# Patient Record
Sex: Female | Born: 1950 | Race: White | Hispanic: No | Smoking: Never smoker
Health system: Southern US, Community
[De-identification: ages and names within clinical notes are randomized; demographics above are authoritative.]

## PROBLEM LIST (undated history)

## (undated) DIAGNOSIS — R519 Headache, unspecified: Secondary | ICD-10-CM

## (undated) DIAGNOSIS — F419 Anxiety disorder, unspecified: Secondary | ICD-10-CM

## (undated) DIAGNOSIS — K219 Gastro-esophageal reflux disease without esophagitis: Secondary | ICD-10-CM

## (undated) DIAGNOSIS — E119 Type 2 diabetes mellitus without complications: Secondary | ICD-10-CM

## (undated) DIAGNOSIS — R7303 Prediabetes: Secondary | ICD-10-CM

## (undated) DIAGNOSIS — J449 Chronic obstructive pulmonary disease, unspecified: Secondary | ICD-10-CM

## (undated) DIAGNOSIS — D649 Anemia, unspecified: Secondary | ICD-10-CM

## (undated) DIAGNOSIS — D869 Sarcoidosis, unspecified: Secondary | ICD-10-CM

## (undated) DIAGNOSIS — R32 Unspecified urinary incontinence: Secondary | ICD-10-CM

## (undated) DIAGNOSIS — R51 Headache: Secondary | ICD-10-CM

## (undated) DIAGNOSIS — I10 Essential (primary) hypertension: Secondary | ICD-10-CM

## (undated) DIAGNOSIS — H269 Unspecified cataract: Secondary | ICD-10-CM

## (undated) DIAGNOSIS — E785 Hyperlipidemia, unspecified: Secondary | ICD-10-CM

## (undated) HISTORY — DX: Anxiety disorder, unspecified: F41.9

## (undated) HISTORY — PX: TUBAL LIGATION: SHX77

## (undated) HISTORY — DX: Type 2 diabetes mellitus without complications: E11.9

## (undated) HISTORY — DX: Hyperlipidemia, unspecified: E78.5

## (undated) HISTORY — DX: Unspecified cataract: H26.9

---

## 1999-01-27 ENCOUNTER — Ambulatory Visit: Admission: RE | Admit: 1999-01-27 | Discharge: 1999-01-27 | Payer: Self-pay | Admitting: Critical Care Medicine

## 2000-01-27 ENCOUNTER — Ambulatory Visit: Admission: RE | Admit: 2000-01-27 | Discharge: 2000-01-27 | Payer: Self-pay | Admitting: Critical Care Medicine

## 2001-03-11 ENCOUNTER — Encounter: Admission: RE | Admit: 2001-03-11 | Discharge: 2001-03-11 | Payer: Self-pay | Admitting: Occupational Medicine

## 2001-03-11 ENCOUNTER — Encounter: Payer: Self-pay | Admitting: Occupational Medicine

## 2007-11-23 ENCOUNTER — Encounter: Admission: RE | Admit: 2007-11-23 | Discharge: 2007-11-23 | Payer: Self-pay | Admitting: Family Medicine

## 2016-05-03 ENCOUNTER — Observation Stay (HOSPITAL_BASED_OUTPATIENT_CLINIC_OR_DEPARTMENT_OTHER): Payer: Self-pay

## 2016-05-03 ENCOUNTER — Encounter (HOSPITAL_COMMUNITY): Payer: Self-pay | Admitting: Emergency Medicine

## 2016-05-03 ENCOUNTER — Observation Stay (HOSPITAL_COMMUNITY)
Admission: EM | Admit: 2016-05-03 | Discharge: 2016-05-03 | Disposition: A | Discharge status: Home / Self Care | Payer: Self-pay | Attending: Internal Medicine | Admitting: Internal Medicine

## 2016-05-03 ENCOUNTER — Emergency Department (HOSPITAL_COMMUNITY): Payer: Self-pay

## 2016-05-03 DIAGNOSIS — R079 Chest pain, unspecified: Secondary | ICD-10-CM

## 2016-05-03 DIAGNOSIS — I160 Hypertensive urgency: Principal | ICD-10-CM | POA: Insufficient documentation

## 2016-05-03 DIAGNOSIS — I10 Essential (primary) hypertension: Secondary | ICD-10-CM | POA: Diagnosis present

## 2016-05-03 DIAGNOSIS — J449 Chronic obstructive pulmonary disease, unspecified: Secondary | ICD-10-CM | POA: Diagnosis present

## 2016-05-03 DIAGNOSIS — D869 Sarcoidosis, unspecified: Secondary | ICD-10-CM | POA: Diagnosis present

## 2016-05-03 DIAGNOSIS — R9431 Abnormal electrocardiogram [ECG] [EKG]: Secondary | ICD-10-CM

## 2016-05-03 DIAGNOSIS — J42 Unspecified chronic bronchitis: Secondary | ICD-10-CM

## 2016-05-03 DIAGNOSIS — Z79899 Other long term (current) drug therapy: Secondary | ICD-10-CM | POA: Insufficient documentation

## 2016-05-03 DIAGNOSIS — M2550 Pain in unspecified joint: Secondary | ICD-10-CM | POA: Insufficient documentation

## 2016-05-03 HISTORY — DX: Chronic obstructive pulmonary disease, unspecified: J44.9

## 2016-05-03 HISTORY — DX: Sarcoidosis, unspecified: D86.9

## 2016-05-03 HISTORY — DX: Essential (primary) hypertension: I10

## 2016-05-03 LAB — BASIC METABOLIC PANEL
ANION GAP: 7 (ref 5–15)
BUN: 19 mg/dL (ref 6–20)
CO2: 26 mmol/L (ref 22–32)
Calcium: 9.6 mg/dL (ref 8.9–10.3)
Chloride: 103 mmol/L (ref 101–111)
Creatinine, Ser: 0.86 mg/dL (ref 0.44–1.00)
GFR calc Af Amer: >60 mL/min (ref >60)
GFR calc non Af Amer: >60 mL/min (ref >60)
GLUCOSE: 156 mg/dL — AB (ref 65–99)
POTASSIUM: 3.6 mmol/L (ref 3.5–5.1)
Sodium: 136 mmol/L (ref 135–145)

## 2016-05-03 LAB — CBC WITH DIFFERENTIAL/PLATELET
BASOS ABS: 0 10*3/uL (ref 0.0–0.1)
Basophils Relative: 0 %
Eosinophils Absolute: 0.1 10*3/uL (ref 0.0–0.7)
Eosinophils Relative: 1 %
HEMATOCRIT: 45.2 % (ref 36.0–46.0)
HEMOGLOBIN: 15.6 g/dL — AB (ref 12.0–15.0)
Lymphocytes Relative: 21 %
Lymphs Abs: 2.2 10*3/uL (ref 0.7–4.0)
MCH: 29.4 pg (ref 26.0–34.0)
MCHC: 34.5 g/dL (ref 30.0–36.0)
MCV: 85.1 fL (ref 78.0–100.0)
Monocytes Absolute: 0.6 10*3/uL (ref 0.1–1.0)
Monocytes Relative: 6 %
NEUTROS ABS: 7.2 10*3/uL (ref 1.7–7.7)
NEUTROS PCT: 72 %
Platelets: 230 10*3/uL (ref 150–400)
RBC: 5.31 MIL/uL — AB (ref 3.87–5.11)
RDW: 12.7 % (ref 11.5–15.5)
WBC: 10.2 10*3/uL (ref 4.0–10.5)

## 2016-05-03 LAB — TROPONIN I
Troponin I: <0.03 ng/mL (ref <0.03)
Troponin I: <0.03 ng/mL (ref <0.03)

## 2016-05-03 LAB — D-DIMER, QUANTITATIVE: D-Dimer, Quant: 0.36 ug/mL-FEU (ref 0.00–0.50)

## 2016-05-03 LAB — ECHOCARDIOGRAM COMPLETE
Height: 63 in
Weight: 3160 oz

## 2016-05-03 MED ORDER — GI COCKTAIL ~~LOC~~
30.0000 mL | Freq: Four times a day (QID) | ORAL | Status: DC | PRN
Start: 2016-05-03 — End: 2016-05-03

## 2016-05-03 MED ORDER — ASPIRIN EC 325 MG PO TBEC
325.0000 mg | DELAYED_RELEASE_TABLET | Freq: Every day | ORAL | Status: DC
  Administered 2016-05-03: 325 mg via ORAL
  Filled 2016-05-03: qty 1

## 2016-05-03 MED ORDER — ACETAMINOPHEN 325 MG PO TABS: 650.0000 mg | ORAL_TABLET | ORAL | Status: DC | PRN

## 2016-05-03 MED ORDER — ONDANSETRON HCL 4 MG/2ML IJ SOLN
4.0000 mg | Freq: Four times a day (QID) | INTRAMUSCULAR | Status: DC | PRN
Start: 2016-05-03 — End: 2016-05-03

## 2016-05-03 MED ORDER — MORPHINE SULFATE (PF) 4 MG/ML IV SOLN
4.0000 mg | Freq: Once | INTRAVENOUS | Status: DC
  Filled 2016-05-03: qty 1

## 2016-05-03 MED ORDER — METOPROLOL TARTRATE 25 MG PO TABS
25.0000 mg | ORAL_TABLET | Freq: Two times a day (BID) | ORAL | Status: DC
  Administered 2016-05-03: 25 mg via ORAL
  Filled 2016-05-03: qty 1

## 2016-05-03 MED ORDER — MORPHINE SULFATE (PF) 2 MG/ML IV SOLN: 2.0000 mg | INTRAVENOUS | Status: DC | PRN

## 2016-05-03 MED ORDER — NITROGLYCERIN 0.4 MG SL SUBL
0.4000 mg | SUBLINGUAL_TABLET | SUBLINGUAL | Status: DC | PRN
  Administered 2016-05-03: 0.4 mg via SUBLINGUAL
  Filled 2016-05-03: qty 1

## 2016-05-03 MED ORDER — ASPIRIN EC 81 MG PO TBEC: 81.0000 mg | DELAYED_RELEASE_TABLET | Freq: Every day | ORAL | Status: DC

## 2016-05-03 NOTE — Progress Notes (Signed)
Pt discharged in stable condition into the care of her daughter via private vehicle.  PIV removed intact and w/o S&S of complications.  Discharge instructions reviewed with pt.  Pt verbalized understanding.  Prescription given to pt.

## 2016-05-03 NOTE — Progress Notes (Signed)
  Echocardiogram 2D Echocardiogram has been performed.  Diamond Nickel 05/03/2016, 11:48 AM

## 2016-05-03 NOTE — ED Provider Notes (Signed)
[facility] DEPT Provider Note   CSN: [id number] Arrival date & time: 05/03/16  0115     History   Chief Complaint Chief Complaint  Patient presents with  . Left arm pain  . Dizziness    HPI Emma Russell is a 66 y.o. female.  Patient reports history of hypertension, sarcoidosis and COPD. She has not had any chronic medications and does not see a doctor. States she has not seen a doctor in 10-15 years. She presents to the ED tonight with left arm pain and achiness that has been hurting her constantly since about 4 PM. The pain is from her shoulder all the way down her arm and feels more like a "ache". It waxes and wanes in severity but never goes away completely. Nothing seen to make it better or worse. She did take aspirin at home as well as some magnesium. He denies any weakness or numbness in the arm. She denies any shortness of breath worse than baseline. She endorses some nausea and dizziness when she stands up. Denies any headache or vision change. Denies any history of heart problems and has never had a stress test. She does not smoke. She was told she was borderline diabetic. Denies any trauma to her arm. It started while she was out shopping. Denies chest pain, back pain, abdominal pain, neck pain.   The history is provided by the patient.  Dizziness  Associated symptoms: no chest pain, no headaches, no nausea, no vomiting and no weakness     Past Medical History:  Diagnosis Date  . COPD (chronic obstructive pulmonary disease) ([place])   . Hypertension   . Sarcoidosis ([place])     There are no active problems to display for this patient.   Past Surgical History:  Procedure Laterality Date  . TUBAL LIGATION      OB History    No data available       Home Medications    Prior to Admission medications   Not on File    Family History History reviewed. No pertinent family history.  Social History Social History  Substance Use Topics  . Smoking status:  Never Smoker  . Smokeless tobacco: Never Used  . Alcohol use Yes     Comment: occ     Allergies   Patient has no known allergies.   Review of Systems Review of Systems  Constitutional: Negative for activity change, appetite change, fatigue and fever.  HENT: Negative for congestion and rhinorrhea.   Respiratory: Negative for chest tightness.   Cardiovascular: Negative for chest pain.  Gastrointestinal: Negative for abdominal pain, nausea and vomiting.  Genitourinary: Negative for dysuria, hematuria, vaginal bleeding and vaginal discharge.  Musculoskeletal: Positive for arthralgias and myalgias.  Skin: Negative for rash.  Neurological: Positive for dizziness and light-headedness. Negative for weakness and headaches.  A complete 10 system review of systems was obtained and all systems are negative except as noted in the HPI and PMH.     Physical Exam Updated Vital Signs BP (!) 209/111 (BP Location: Left Arm)   Pulse 101   Temp 98.5 F (36.9 C) (Oral)   Resp 14   Ht 5\' 3"  (1.6 m)   Wt 194 lb (88 kg)   SpO2 98%   BMI 34.37 kg/m   Physical Exam  Constitutional: She is oriented to person, place, and time. She appears well-developed and well-nourished. No distress.  HENT:  Head: Normocephalic and atraumatic.  Mouth/Throat: Oropharynx is clear and moist. No oropharyngeal  exudate.  Eyes: Conjunctivae and EOM are normal. Pupils are equal, round, and reactive to light.  Neck: Normal range of motion. Neck supple.  No meningismus.  Cardiovascular: Normal rate, regular rhythm, normal heart sounds and intact distal pulses.   No murmur heard. Pulmonary/Chest: Effort normal and breath sounds normal. No respiratory distress. She exhibits no tenderness.  Abdominal: Soft. There is no tenderness. There is no rebound and no guarding.  Musculoskeletal: Normal range of motion. She exhibits no edema or tenderness.  Intact radial pulses bilaterally. Equal grip strengths.  FROM L shoulder,  elbow, hand, wrist  Neurological: She is alert and oriented to person, place, and time. No cranial nerve deficit. She exhibits normal muscle tone. Coordination normal.  No ataxia on finger to nose bilaterally. No pronator drift. 5/5 strength throughout. CN 2-12 intact.Equal grip strength. Sensation intact.   Skin: Skin is warm. Capillary refill takes less than 2 seconds.  Psychiatric: She has a normal mood and affect. Her behavior is normal.  Nursing note and vitals reviewed.    ED Treatments / Results  Labs (all labs ordered are listed, but only abnormal results are displayed) Labs Reviewed  CBC WITH DIFFERENTIAL/PLATELET - Abnormal; Notable for the following:       Result Value   RBC 5.31 (*)    Hemoglobin 15.6 (*)    All other components within normal limits  BASIC METABOLIC PANEL - Abnormal; Notable for the following:    Glucose, Bld 156 (*)    All other components within normal limits  TROPONIN I  D-DIMER, QUANTITATIVE (NOT AT [facility])    EKG  EKG Interpretation  Date/Time:  Sunday May 03 2016 01:32:28 EST Ventricular Rate:  108 PR Interval:    QRS Duration: 81 QT Interval:  327 QTC Calculation: 439 R Axis:   92 Text Interpretation:  Sinus tachycardia Right axis deviation Low voltage, precordial leads Probable anteroseptal infarct, old No previous ECGs available Confirmed by Wyvonnia Dusky  MD, Chasitie Passey [phone]) on 05/03/2016 1:49:26 AM       Radiology Dg Chest 2 View  Result Date: 05/03/2016 CLINICAL DATA:  Left arm pain with dizziness and nausea EXAM: CHEST  2 VIEW COMPARISON:  None. FINDINGS: Mild hyperinflation. Diffuse interstitial opacities possibly chronic but cannot exclude acute or superimposed interstitial process. No focal consolidation or effusion. Normal heart size. No pneumothorax. IMPRESSION: Diffuse interstitial opacities, possible chronic although in the absence of priors, difficult to exclude acute interstitial inflammation. No focal consolidation. Probable  areas of scarring in the left upper lobe and right mid lung. Electronically Signed   By: Donavan Foil M.D.   On: 05/03/2016 03:27    Procedures Procedures (including critical care time)  Medications Ordered in ED Medications - No data to display   Initial Impression / Assessment and Plan / ED Course  I have reviewed the triage vital signs and the nursing notes.  Pertinent labs & imaging results that were available during my care of the patient were reviewed by me and considered in my medical decision making (see chart for details).     Ongoing L arm pain for >8 hours.  Waxes and wanes in severity. No chest pain.  Associated with dizziness and nausea. EKG with sinus tachycardia. No ST changes.  BP 209 on L, 207 on R.  No chest or back pain. No neuro or pulse deficits.   Remains hypertensive but equal in each arm.  Troponin negative. D-dimer negative. Heart score 4.  Remains chest pain free in the  ED.  Declined nitroglycerin.  BP has improved to 185/101. Doubt pulmonary embolism, aortic dissection. Plan chest pain rule out. Observation admission d/w Dr. Shanon Brow.    Final Clinical Impressions(s) / ED Diagnoses   Final diagnoses:  Chest pain, unspecified type  Hypertensive urgency    New Prescriptions New Prescriptions   No medications on file     Ezequiel Essex, MD 05/03/16 [phone]

## 2016-05-03 NOTE — H&P (Signed)
History and Physical    LEVETA DEANGELO [id number] DOB: 12-07-1950 DOA: 05/03/2016  PCP: No PCP Per Patient  Patient coming from: home  Chief Complaint:   Arm pain  HPI: JERUSALEN LAMASTUS is a 66 y.o. female with medical history significant of sarcoidosis, HTN, comes in with left arm "achiness" that started around 2pm today and has progressively gotten worse.  The pain is in the left shoulder and arm, does not radiate anywhere or in the chest.  It is not worse with movement.  No recent injury.  She has not seen a doctor in years.  She has untreated hypertension.  Pt refused to take ntg or morphine in ED.  Her bp has come down alone and she says her "achiness" is now resolved.  She does not like to take medications.  She denies any fevers or cough.  Pt referred for admission for possible ACS.  Review of Systems: As per HPI otherwise 10 point review of systems negative.   Past Medical History:  Diagnosis Date  . COPD (chronic obstructive pulmonary disease) ([place])   . Hypertension   . Sarcoidosis [facility])     Past Surgical History:  Procedure Laterality Date  . TUBAL LIGATION       reports that she has never smoked. She has never used smokeless tobacco. She reports that she drinks alcohol. She reports that she does not use drugs.  No Known Allergies  History reviewed. No pertinent family history. no CAD  Prior to Admission medications   Not on File  none  Physical Exam: Vitals:   05/03/16 0230 05/03/16 0300 05/03/16 0330 05/03/16 0400  BP: 191/96 183/90 (!) 178/101 (!) 185/104  Pulse: 100 95 99 90  Resp: 20 12 15 11   Temp:      TempSrc:      SpO2: 93% 90% 95% 93%  Weight:      Height:        Constitutional: NAD, calm, comfortable Vitals:   05/03/16 0230 05/03/16 0300 05/03/16 0330 05/03/16 0400  BP: 191/96 183/90 (!) 178/101 (!) 185/104  Pulse: 100 95 99 90  Resp: 20 12 15 11   Temp:      TempSrc:      SpO2: 93% 90% 95% 93%  Weight:      Height:       Eyes: PERRL,  lids and conjunctivae normal ENMT: Mucous membranes are moist. Posterior pharynx clear of any exudate or lesions.Normal dentition.  Neck: normal, supple, no masses, no thyromegaly Respiratory: clear to auscultation bilaterally, no wheezing, no crackles. Normal respiratory effort. No accessory muscle use.  Cardiovascular: Regular rate and rhythm, no murmurs / rubs / gallops. No extremity edema. 2+ pedal pulses. No carotid bruits.  Abdomen: no tenderness, no masses palpated. No hepatosplenomegaly. Bowel sounds positive.  Musculoskeletal: no clubbing / cyanosis. No joint deformity upper and lower extremities. Good ROM, no contractures. Normal muscle tone.  Skin: no rashes, lesions, ulcers. No induration Neurologic: CN 2-12 grossly intact. Sensation intact, DTR normal. Strength 5/5 in all 4.  Psychiatric: Normal judgment and insight. Alert and oriented x 3. Normal mood.    Labs on Admission: I have personally reviewed following labs and imaging studies  CBC:  Recent Labs Lab 05/03/16 0219  WBC 10.2  NEUTROABS 7.2  HGB 15.6*  HCT 45.2  MCV 85.1  PLT 123456   Basic Metabolic Panel:  Recent Labs Lab 05/03/16 0219  NA 136  K 3.6  CL 103  CO2 26  GLUCOSE  156*  BUN 19  CREATININE 0.86  CALCIUM 9.6   GFR: Estimated Creatinine Clearance: 69.5 mL/min (by C-G formula based on SCr of 0.86 mg/dL).  Cardiac Enzymes:  Recent Labs Lab 05/03/16 0219  TROPONINI <0.03   Radiological Exams on Admission: Dg Chest 2 View  Result Date: 05/03/2016 CLINICAL DATA:  Left arm pain with dizziness and nausea EXAM: CHEST  2 VIEW COMPARISON:  None. FINDINGS: Mild hyperinflation. Diffuse interstitial opacities possibly chronic but cannot exclude acute or superimposed interstitial process. No focal consolidation or effusion. Normal heart size. No pneumothorax. IMPRESSION: Diffuse interstitial opacities, possible chronic although in the absence of priors, difficult to exclude acute interstitial  inflammation. No focal consolidation. Probable areas of scarring in the left upper lobe and right mid lung. Electronically Signed   By: Donavan Foil M.D.   On: 05/03/2016 03:27    EKG: Independently reviewed. Sinus tachycardia no acute issues cxr reviewed no edema or infiltrate   Assessment/Plan 66 yo female with atypical left arm pain with uncontrolled hypertension  Principal Problem:   Chest pain- obs on tele.  Romi.  Echo in am.  Will need complete work up if all neg with stress testing which can be done as outpatient if she remains pain free and work up neg here.  Aspirin.  Control bp.  Active Problems:   Sarcoidosis ([place])- noted   Hypertension- start on bblocker   COPD (chronic obstructive pulmonary disease) ([place])- stable and compensated   DVT prophylaxis: scds  Code Status:  full Family Communication: yes Disposition Plan:  Per day team Consults called:  none Admission status:  observation   Sara Selvidge A MD [facility]  If 7PM-7AM, please contact night-coverage [email or web address] Password [facility]  05/03/2016, 4:54 AM

## 2016-05-03 NOTE — Discharge Summary (Signed)
Physician Discharge Summary  Emma Russell [id number] DOB: Aug 08, 1950 DOA: 05/03/2016  PCP: No PCP Per Patient  Admit date: 05/03/2016 Discharge date: 05/03/2016  Time spent: 45 minutes  Recommendations for Outpatient Follow-up:  -Will be discharged home today. -Needs follow up with PCP in 1-2 weeks.   Discharge Diagnoses:  Principal Problem:   Chest pain Active Problems:   Sarcoidosis [facility])   Essential hypertension   COPD (chronic obstructive pulmonary disease) ([place])   Discharge Condition: Stable and improved  Filed Weights   05/03/16 0126 05/03/16 0541  Weight: 88 kg (194 lb) 89.6 kg (197 lb 8 oz)    History of present illness:  As per Dr. Shanon Brow on 2/25:  Emma Russell is a 66 y.o. female with medical history significant of sarcoidosis, HTN, comes in with left arm "achiness" that started around 2pm today and has progressively gotten worse.  The pain is in the left shoulder and arm, does not radiate anywhere or in the chest.  It is not worse with movement.  No recent injury.  She has not seen a doctor in years.  She has untreated hypertension.  Pt refused to take ntg or morphine in ED.  Her bp has come down alone and she says her "achiness" is now resolved.  She does not like to take medications.  She denies any fevers or cough.  Pt referred for admission for possible ACS.  Hospital Course:   Chest Pain -Will be discharged home today as she has ruled out for ACS by negative troponins and EKG without acute ischemic changes. -No need for further inpatient or outpatient cardiac work up at present.  High BP without HTN -Has never been formally diagnosed with HTN. -BP now the 120/70 range despite being in the 180/110 range on admission. -Will not treat at this time but have recommended close follow up with PCP.  Procedures:  ECHO: Normal LV systolic function; grade 1 diastolic dysfunction.  Consultations:  None  Discharge Instructions  Discharge Instructions     Diet - low sodium heart healthy    Complete by:  As directed    Increase activity slowly    Complete by:  As directed      Allergies as of 05/03/2016   No Known Allergies     Medication List    TAKE these medications   aspirin EC 81 MG tablet Take 1 tablet (81 mg total) by mouth daily.   CAYENNE PO Take 1 tablet by mouth daily as needed (blood pressure).   CO Q-10 PO Take by mouth.   EYEBRIGHT HERB PO Take 2 tablets by mouth daily.   FISH OIL PO Take 1 capsule by mouth daily.   HAWTHORN PO Take by mouth.   ibuprofen 200 MG tablet Commonly known as:  ADVIL,MOTRIN Take 200-400 mg by mouth every 6 (six) hours as needed for moderate pain.   Magnesium Gluconate Powd Take 400 mg by mouth daily.   VITAMIN B 12 PO Take 1 tablet by mouth daily.   vitamin C 1000 MG tablet Take 2,000 mg by mouth daily.      No Known Allergies    The results of significant diagnostics from this hospitalization (including imaging, microbiology, ancillary and laboratory) are listed below for reference.    Significant Diagnostic Studies: Dg Chest 2 View  Result Date: 05/03/2016 CLINICAL DATA:  Left arm pain with dizziness and nausea EXAM: CHEST  2 VIEW COMPARISON:  None. FINDINGS: Mild hyperinflation. Diffuse interstitial opacities  possibly chronic but cannot exclude acute or superimposed interstitial process. No focal consolidation or effusion. Normal heart size. No pneumothorax. IMPRESSION: Diffuse interstitial opacities, possible chronic although in the absence of priors, difficult to exclude acute interstitial inflammation. No focal consolidation. Probable areas of scarring in the left upper lobe and right mid lung. Electronically Signed   By: Donavan Foil M.D.   On: 05/03/2016 03:27    Microbiology: No results found for this or any previous visit (from the past 240 hour(s)).   Labs: Basic Metabolic Panel:  Recent Labs Lab 05/03/16 0219  NA 136  K 3.6  CL 103  CO2 26    GLUCOSE 156*  BUN 19  CREATININE 0.86  CALCIUM 9.6   Liver Function Tests: No results for input(s): AST, ALT, ALKPHOS, BILITOT, PROT, ALBUMIN in the last 168 hours. No results for input(s): LIPASE, AMYLASE in the last 168 hours. No results for input(s): AMMONIA in the last 168 hours. CBC:  Recent Labs Lab 05/03/16 0219  WBC 10.2  NEUTROABS 7.2  HGB 15.6*  HCT 45.2  MCV 85.1  PLT 230   Cardiac Enzymes:  Recent Labs Lab 05/03/16 0219 05/03/16 0621 05/03/16 0858 05/03/16 1228  TROPONINI <0.03 <0.03 <0.03 <0.03   BNP: BNP (last 3 results) No results for input(s): BNP in the last 8760 hours.  ProBNP (last 3 results) No results for input(s): PROBNP in the last 8760 hours.  CBG: No results for input(s): GLUCAP in the last 168 hours.     SignedLelon Frohlich  [facility] Pager: [phone] 05/03/2016, 4:32 PM

## 2016-05-03 NOTE — ED Triage Notes (Signed)
Per pt she started having left arm pain with dizziness and nausea today around 1400.

## 2016-05-27 ENCOUNTER — Encounter: Payer: Self-pay | Admitting: Family Medicine

## 2016-05-27 ENCOUNTER — Other Ambulatory Visit: Payer: Self-pay | Admitting: Family Medicine

## 2016-05-27 ENCOUNTER — Ambulatory Visit (INDEPENDENT_AMBULATORY_CARE_PROVIDER_SITE_OTHER): Payer: Self-pay | Admitting: Family Medicine

## 2016-05-27 VITALS — BP 202/98 | HR 88 | Temp 98.0°F | Resp 18 | Ht 63.0 in | Wt 192.0 lb

## 2016-05-27 DIAGNOSIS — R7303 Prediabetes: Secondary | ICD-10-CM

## 2016-05-27 DIAGNOSIS — R0683 Snoring: Secondary | ICD-10-CM

## 2016-05-27 DIAGNOSIS — Z7689 Persons encountering health services in other specified circumstances: Secondary | ICD-10-CM

## 2016-05-27 DIAGNOSIS — E559 Vitamin D deficiency, unspecified: Secondary | ICD-10-CM | POA: Insufficient documentation

## 2016-05-27 DIAGNOSIS — Z2821 Immunization not carried out because of patient refusal: Secondary | ICD-10-CM | POA: Insufficient documentation

## 2016-05-27 DIAGNOSIS — I10 Essential (primary) hypertension: Secondary | ICD-10-CM

## 2016-05-27 DIAGNOSIS — E785 Hyperlipidemia, unspecified: Secondary | ICD-10-CM | POA: Insufficient documentation

## 2016-05-27 LAB — LIPID PANEL
Cholesterol: 192 mg/dL (ref <200)
HDL: 41 mg/dL — ABNORMAL LOW (ref >50)
LDL CALC: 124 mg/dL — AB (ref <100)
Total CHOL/HDL Ratio: 4.7 Ratio (ref <5.0)
Triglycerides: 134 mg/dL (ref <150)
VLDL: 27 mg/dL (ref <30)

## 2016-05-27 MED ORDER — HYDROCHLOROTHIAZIDE 12.5 MG PO CAPS: 12.5000 mg | ORAL_CAPSULE | Freq: Every day | ORAL | 11 refills | Status: DC

## 2016-05-27 NOTE — Progress Notes (Signed)
Chief Complaint  Patient presents with  . Establish Care   New to establish No old records available, has not gotten regular medical care for years Went to the ER for arm pain and was found to have high blood pressure. Says she has been told she has high cholesterol as well. NO recent PAP, mammo or colon cancer screening Refuses immunizations Takes many supplements Walks twice a day (new since ER visit)   Patient Active Problem List   Diagnosis Date Noted  . Vitamin D deficiency 05/27/2016  . Prediabetes 05/27/2016  . Snores 05/27/2016  . HLD (hyperlipidemia) 05/27/2016  . Immunization refused 05/27/2016  . Chest pain 05/03/2016  . Sarcoidosis ([place])   . Essential hypertension   . COPD (chronic obstructive pulmonary disease) [facility])     Outpatient Encounter Prescriptions as of 05/27/2016  Medication Sig  . Ascorbic Acid (VITAMIN C) 1000 MG tablet Take 2,000 mg by mouth daily.  . Capsicum, Cayenne, (CAYENNE PO) Take 1 tablet by mouth daily as needed (blood pressure).  . Coenzyme Q10 (CO Q-10 PO) Take by mouth.  . Cyanocobalamin (VITAMIN B 12 PO) Take 1 tablet by mouth daily.  Marland Kitchen EYEBRIGHT HERB PO Take 2 tablets by mouth daily.  Marland Kitchen ibuprofen (ADVIL,MOTRIN) 200 MG tablet Take 200-400 mg by mouth every 6 (six) hours as needed for moderate pain.  . Magnesium Gluconate POWD Take 400 mg by mouth daily.  . Omega-3 Fatty Acids (FISH OIL PO) Take 1 capsule by mouth daily.  . hydrochlorothiazide (MICROZIDE) 12.5 MG capsule Take 1 capsule (12.5 mg total) by mouth daily.   No facility-administered encounter medications on file as of 05/27/2016.     Past Medical History:  Diagnosis Date  . Anxiety   . Cataract   . COPD (chronic obstructive pulmonary disease) ([place])   . Diabetes mellitus without complication ([place])   . Hyperlipidemia   . Hypertension   . Sarcoidosis [facility])     Past Surgical History:  Procedure Laterality Date  . TUBAL LIGATION      Social History   Social  History  . Marital status: Widowed    Spouse name: N/A  . Number of children: 4  . Years of education: 12   Occupational History  . Armed forces logistics/support/administrative officer   Social History Main Topics  . Smoking status: Never Smoker  . Smokeless tobacco: Never Used  . Alcohol use Yes     Comment: occ  . Drug use: No  . Sexual activity: Not Currently   Other Topics Concern  . Not on file   Social History Narrative   Lives at home with grandson Mckinley Jewel has ADHD and anxiety issues   His father Dellis Filbert is addicted and comes and goes   Is a widow    Family History  Problem Relation Age of Onset  . COPD Mother   . Diabetes Mother   . Hepatitis C Mother   . Cancer Father     lung  . Alcohol abuse Father   . Drug abuse Son 32    overdose  . Drug abuse Son   . Hepatitis C Son   . Cancer Maternal Aunt     Review of Systems  Constitutional: Negative for chills, fever and weight loss.  HENT: Negative for congestion and hearing loss.   Eyes: Negative for blurred vision and pain.  Respiratory: Negative for cough and shortness of breath.   Cardiovascular: Negative for chest pain and  leg swelling.  Gastrointestinal: Negative for abdominal pain, constipation, diarrhea and heartburn.  Genitourinary: Negative for dysuria and frequency.  Musculoskeletal: Negative for falls, joint pain and myalgias.  Neurological: Negative for dizziness, seizures and headaches.  Psychiatric/Behavioral: Negative for depression. The patient is nervous/anxious and has insomnia.     BP (!) 202/98 (BP Location: Left Arm, Patient Position: Sitting, Cuff Size: Normal)   Pulse 88   Temp 98 F (36.7 C) (Temporal)   Resp 18   Ht 5\' 3"  (1.6 m)   Wt 192 lb 0.6 oz (87.1 kg)   SpO2 97%   BMI 34.02 kg/m   Physical Exam  Constitutional: She is oriented to person, place, and time. She appears well-developed and well-nourished.  HENT:  Head: Normocephalic and atraumatic.  Right Ear: External ear  normal.  Left Ear: External ear normal.  Mouth/Throat: Oropharynx is clear and moist.  Eyes: Conjunctivae are normal. Pupils are equal, round, and reactive to light.  Early cataracts  Neck: Normal range of motion. Neck supple. No thyromegaly present.  Cardiovascular: Normal rate, regular rhythm and normal heart sounds.   Pulmonary/Chest: Effort normal and breath sounds normal. No respiratory distress.  Abdominal: Soft. Bowel sounds are normal.  Musculoskeletal: Normal range of motion. She exhibits no edema.  Lymphadenopathy:    She has no cervical adenopathy.  Neurological: She is alert and oriented to person, place, and time.  Gait normal  Skin: Skin is warm and dry.  Psychiatric: She has a normal mood and affect. Her behavior is normal. Thought content normal.  Nursing note and vitals reviewed. ASSESSMENT/PLAN:   1. Encounter to establish care with new doctor   2. Vitamin D deficiency  - VITAMIN D 25 Hydroxy (Vit-D Deficiency, Fractures)  3. Prediabetes  - Hemoglobin A1c   4. Essential hypertension   5. Snores   6. Hyperlipidemia, unspecified hyperlipidemia type  - lipid panel  7. Immunization refused  discussed   Patient Instructions  Continue to walk every day that you are able Watch the sweets and fats in your diet  I have ordered lab testing I will send you a letter with your test results.  If there is anything of concern, we will call right away.  Need to take the hydrochlorothiazide once a day in the morning Drink more water Need to come back in 2-3 weeks for a nurse to check the blood pressure  See me for a check up in November    DASH Eating Plan DASH stands for "Dietary Approaches to Stop Hypertension." The DASH eating plan is a healthy eating plan that has been shown to reduce high blood pressure (hypertension). It may also reduce your risk for type 2 diabetes, heart disease, and stroke. The DASH eating plan may also help with weight  loss. What are tips for following this plan? General guidelines   Avoid eating more than 2,300 mg (milligrams) of salt (sodium) a day. If you have hypertension, you may need to reduce your sodium intake to 1,500 mg a day.  Limit alcohol intake to no more than 1 drink a day for nonpregnant women and 2 drinks a day for men. One drink equals 12 oz of beer, 5 oz of wine, or 1 oz of hard liquor.  Work with your health care provider to maintain a healthy body weight or to lose weight. Ask what an ideal weight is for you.  Get at least 30 minutes of exercise that causes your heart to beat faster (aerobic exercise) most  days of the week. Activities may include walking, swimming, or biking.  Work with your health care provider or diet and nutrition specialist (dietitian) to adjust your eating plan to your individual calorie needs. Reading food labels   Check food labels for the amount of sodium per serving. Choose foods with less than 5 percent of the Daily Value of sodium. Generally, foods with less than 300 mg of sodium per serving fit into this eating plan.  To find whole grains, look for the word "whole" as the first word in the ingredient list. Shopping   Buy products labeled as "low-sodium" or "no salt added."  Buy fresh foods. Avoid canned foods and premade or frozen meals. Cooking   Avoid adding salt when cooking. Use salt-free seasonings or herbs instead of table salt or sea salt. Check with your health care provider or pharmacist before using salt substitutes.  Do not fry foods. Cook foods using healthy methods such as baking, boiling, grilling, and broiling instead.  Cook with heart-healthy oils, such as olive, canola, soybean, or sunflower oil. Meal planning    Eat a balanced diet that includes:  5 or more servings of fruits and vegetables each day. At each meal, try to fill half of your plate with fruits and vegetables.  Up to 6-8 servings of whole grains each day.  Less  than 6 oz of lean meat, poultry, or fish each day. A 3-oz serving of meat is about the same size as a deck of cards. One egg equals 1 oz.  2 servings of low-fat dairy each day.  A serving of nuts, seeds, or beans 5 times each week.  Heart-healthy fats. Healthy fats called Omega-3 fatty acids are found in foods such as flaxseeds and coldwater fish, like sardines, salmon, and mackerel.  Limit how much you eat of the following:  Canned or prepackaged foods.  Food that is high in trans fat, such as fried foods.  Food that is high in saturated fat, such as fatty meat.  Sweets, desserts, sugary drinks, and other foods with added sugar.  Full-fat dairy products.  Do not salt foods before eating.  Try to eat at least 2 vegetarian meals each week.  Eat more home-cooked food and less restaurant, buffet, and fast food.  When eating at a restaurant, ask that your food be prepared with less salt or no salt, if possible. What foods are recommended? The items listed may not be a complete list. Talk with your dietitian about what dietary choices are best for you. Grains  Whole-grain or whole-wheat bread. Whole-grain or whole-wheat pasta. Brown rice. [place] Morrow. Bulgur. Whole-grain and low-sodium cereals. Pita bread. Low-fat, low-sodium crackers. Whole-wheat flour tortillas. Vegetables  Fresh or frozen vegetables (raw, steamed, roasted, or grilled). Low-sodium or reduced-sodium tomato and vegetable juice. Low-sodium or reduced-sodium tomato sauce and tomato paste. Low-sodium or reduced-sodium canned vegetables. Fruits  All fresh, dried, or frozen fruit. Canned fruit in natural juice (without added sugar). Meat and other protein foods  Skinless chicken or [place]. Ground chicken or [place]. Pork with fat trimmed off. Fish and seafood. Egg whites. Dried beans, peas, or lentils. Unsalted nuts, nut butters, and seeds. Unsalted canned beans. Lean cuts of beef with fat trimmed off. Low-sodium, lean  deli meat. Dairy  Low-fat (1%) or fat-free (skim) milk. Fat-free, low-fat, or reduced-fat cheeses. Nonfat, low-sodium ricotta or cottage cheese. Low-fat or nonfat yogurt. Low-fat, low-sodium cheese. Fats and oils  Soft margarine without trans fats. Vegetable oil. Low-fat, reduced-fat, or  light mayonnaise and salad dressings (reduced-sodium). Canola, safflower, olive, soybean, and sunflower oils. Avocado. Seasoning and other foods  Herbs. Spices. Seasoning mixes without salt. Unsalted popcorn and pretzels. Fat-free sweets. What foods are not recommended? The items listed may not be a complete list. Talk with your dietitian about what dietary choices are best for you. Grains  Baked goods made with fat, such as croissants, muffins, or some breads. Dry pasta or rice meal packs. Vegetables  Creamed or fried vegetables. Vegetables in a cheese sauce. Regular canned vegetables (not low-sodium or reduced-sodium). Regular canned tomato sauce and paste (not low-sodium or reduced-sodium). Regular tomato and vegetable juice (not low-sodium or reduced-sodium). Angie Fava. Olives. Fruits  Canned fruit in a light or heavy syrup. Fried fruit. Fruit in cream or butter sauce. Meat and other protein foods  Fatty cuts of meat. Ribs. Fried meat. Berniece Salines. Sausage. Bologna and other processed lunch meats. Salami. Fatback. Hotdogs. Bratwurst. Salted nuts and seeds. Canned beans with added salt. Canned or smoked fish. Whole eggs or egg yolks. Chicken or [place] with skin. Dairy  Whole or 2% milk, cream, and half-and-half. Whole or full-fat cream cheese. Whole-fat or sweetened yogurt. Full-fat cheese. Nondairy creamers. Whipped toppings. Processed cheese and cheese spreads. Fats and oils  Butter. Stick margarine. Lard. Shortening. Ghee. Bacon fat. Tropical oils, such as coconut, palm kernel, or palm oil. Seasoning and other foods  Salted popcorn and pretzels. Onion salt, garlic salt, seasoned salt, table salt, and sea salt.  Worcestershire sauce. Tartar sauce. Barbecue sauce. Teriyaki sauce. Soy sauce, including reduced-sodium. Steak sauce. Canned and packaged gravies. Fish sauce. Oyster sauce. Cocktail sauce. Horseradish that you find on the shelf. Ketchup. Mustard. Meat flavorings and tenderizers. Bouillon cubes. Hot sauce and Tabasco sauce. Premade or packaged marinades. Premade or packaged taco seasonings. Relishes. Regular salad dressings. Where to find more information:  National Heart, Lung, and [place]: https://wilson-eaton.com/  American Heart Association: www.heart.org Summary  The DASH eating plan is a healthy eating plan that has been shown to reduce high blood pressure (hypertension). It may also reduce your risk for type 2 diabetes, heart disease, and stroke.  With the DASH eating plan, you should limit salt (sodium) intake to 2,300 mg a day. If you have hypertension, you may need to reduce your sodium intake to 1,500 mg a day.  When on the DASH eating plan, aim to eat more fresh fruits and vegetables, whole grains, lean proteins, low-fat dairy, and heart-healthy fats.  Work with your health care provider or diet and nutrition specialist (dietitian) to adjust your eating plan to your individual calorie needs. This information is not intended to replace advice given to you by your health care provider. Make sure you discuss any questions you have with your health care provider. Document Released: 02/12/2011 Document Revised: 02/17/2016 Document Reviewed: 02/17/2016 Elsevier Interactive Patient Education  2017 Elsevier Inc.    Raylene Everts, MD

## 2016-05-27 NOTE — Patient Instructions (Addendum)
Continue to walk every day that you are able Watch the sweets and fats in your diet  I have ordered lab testing I will send you a letter with your test results.  If there is anything of concern, we will call right away.  Need to take the hydrochlorothiazide once a day in the morning Drink more water Need to come back in 2-3 weeks for a nurse to check the blood pressure  See me for a check up in November    DASH Eating Plan DASH stands for "Dietary Approaches to Stop Hypertension." The DASH eating plan is a healthy eating plan that has been shown to reduce high blood pressure (hypertension). It may also reduce your risk for type 2 diabetes, heart disease, and stroke. The DASH eating plan may also help with weight loss. What are tips for following this plan? General guidelines   Avoid eating more than 2,300 mg (milligrams) of salt (sodium) a day. If you have hypertension, you may need to reduce your sodium intake to 1,500 mg a day.  Limit alcohol intake to no more than 1 drink a day for nonpregnant women and 2 drinks a day for men. One drink equals 12 oz of beer, 5 oz of wine, or 1 oz of hard liquor.  Work with your health care provider to maintain a healthy body weight or to lose weight. Ask what an ideal weight is for you.  Get at least 30 minutes of exercise that causes your heart to beat faster (aerobic exercise) most days of the week. Activities may include walking, swimming, or biking.  Work with your health care provider or diet and nutrition specialist (dietitian) to adjust your eating plan to your individual calorie needs. Reading food labels   Check food labels for the amount of sodium per serving. Choose foods with less than 5 percent of the Daily Value of sodium. Generally, foods with less than 300 mg of sodium per serving fit into this eating plan.  To find whole grains, look for the word "whole" as the first word in the ingredient list. Shopping   Buy products labeled  as "low-sodium" or "no salt added."  Buy fresh foods. Avoid canned foods and premade or frozen meals. Cooking   Avoid adding salt when cooking. Use salt-free seasonings or herbs instead of table salt or sea salt. Check with your health care provider or pharmacist before using salt substitutes.  Do not fry foods. Cook foods using healthy methods such as baking, boiling, grilling, and broiling instead.  Cook with heart-healthy oils, such as olive, canola, soybean, or sunflower oil. Meal planning    Eat a balanced diet that includes:  5 or more servings of fruits and vegetables each day. At each meal, try to fill half of your plate with fruits and vegetables.  Up to 6-8 servings of whole grains each day.  Less than 6 oz of lean meat, poultry, or fish each day. A 3-oz serving of meat is about the same size as a deck of cards. One egg equals 1 oz.  2 servings of low-fat dairy each day.  A serving of nuts, seeds, or beans 5 times each week.  Heart-healthy fats. Healthy fats called Omega-3 fatty acids are found in foods such as flaxseeds and coldwater fish, like sardines, salmon, and mackerel.  Limit how much you eat of the following:  Canned or prepackaged foods.  Food that is high in trans fat, such as fried foods.  Food that is  high in saturated fat, such as fatty meat.  Sweets, desserts, sugary drinks, and other foods with added sugar.  Full-fat dairy products.  Do not salt foods before eating.  Try to eat at least 2 vegetarian meals each week.  Eat more home-cooked food and less restaurant, buffet, and fast food.  When eating at a restaurant, ask that your food be prepared with less salt or no salt, if possible. What foods are recommended? The items listed may not be a complete list. Talk with your dietitian about what dietary choices are best for you. Grains  Whole-grain or whole-wheat bread. Whole-grain or whole-wheat pasta. Brown rice. [place] Morrow. Bulgur.  Whole-grain and low-sodium cereals. Pita bread. Low-fat, low-sodium crackers. Whole-wheat flour tortillas. Vegetables  Fresh or frozen vegetables (raw, steamed, roasted, or grilled). Low-sodium or reduced-sodium tomato and vegetable juice. Low-sodium or reduced-sodium tomato sauce and tomato paste. Low-sodium or reduced-sodium canned vegetables. Fruits  All fresh, dried, or frozen fruit. Canned fruit in natural juice (without added sugar). Meat and other protein foods  Skinless chicken or [place]. Ground chicken or [place]. Pork with fat trimmed off. Fish and seafood. Egg whites. Dried beans, peas, or lentils. Unsalted nuts, nut butters, and seeds. Unsalted canned beans. Lean cuts of beef with fat trimmed off. Low-sodium, lean deli meat. Dairy  Low-fat (1%) or fat-free (skim) milk. Fat-free, low-fat, or reduced-fat cheeses. Nonfat, low-sodium ricotta or cottage cheese. Low-fat or nonfat yogurt. Low-fat, low-sodium cheese. Fats and oils  Soft margarine without trans fats. Vegetable oil. Low-fat, reduced-fat, or light mayonnaise and salad dressings (reduced-sodium). Canola, safflower, olive, soybean, and sunflower oils. Avocado. Seasoning and other foods  Herbs. Spices. Seasoning mixes without salt. Unsalted popcorn and pretzels. Fat-free sweets. What foods are not recommended? The items listed may not be a complete list. Talk with your dietitian about what dietary choices are best for you. Grains  Baked goods made with fat, such as croissants, muffins, or some breads. Dry pasta or rice meal packs. Vegetables  Creamed or fried vegetables. Vegetables in a cheese sauce. Regular canned vegetables (not low-sodium or reduced-sodium). Regular canned tomato sauce and paste (not low-sodium or reduced-sodium). Regular tomato and vegetable juice (not low-sodium or reduced-sodium). Angie Fava. Olives. Fruits  Canned fruit in a light or heavy syrup. Fried fruit. Fruit in cream or butter sauce. Meat and other  protein foods  Fatty cuts of meat. Ribs. Fried meat. Berniece Salines. Sausage. Bologna and other processed lunch meats. Salami. Fatback. Hotdogs. Bratwurst. Salted nuts and seeds. Canned beans with added salt. Canned or smoked fish. Whole eggs or egg yolks. Chicken or [place] with skin. Dairy  Whole or 2% milk, cream, and half-and-half. Whole or full-fat cream cheese. Whole-fat or sweetened yogurt. Full-fat cheese. Nondairy creamers. Whipped toppings. Processed cheese and cheese spreads. Fats and oils  Butter. Stick margarine. Lard. Shortening. Ghee. Bacon fat. Tropical oils, such as coconut, palm kernel, or palm oil. Seasoning and other foods  Salted popcorn and pretzels. Onion salt, garlic salt, seasoned salt, table salt, and sea salt. Worcestershire sauce. Tartar sauce. Barbecue sauce. Teriyaki sauce. Soy sauce, including reduced-sodium. Steak sauce. Canned and packaged gravies. Fish sauce. Oyster sauce. Cocktail sauce. Horseradish that you find on the shelf. Ketchup. Mustard. Meat flavorings and tenderizers. Bouillon cubes. Hot sauce and Tabasco sauce. Premade or packaged marinades. Premade or packaged taco seasonings. Relishes. Regular salad dressings. Where to find more information:  National Heart, Lung, and [place]: https://wilson-eaton.com/  American Heart Association: www.heart.org Summary  The DASH eating plan is a  healthy eating plan that has been shown to reduce high blood pressure (hypertension). It may also reduce your risk for type 2 diabetes, heart disease, and stroke.  With the DASH eating plan, you should limit salt (sodium) intake to 2,300 mg a day. If you have hypertension, you may need to reduce your sodium intake to 1,500 mg a day.  When on the DASH eating plan, aim to eat more fresh fruits and vegetables, whole grains, lean proteins, low-fat dairy, and heart-healthy fats.  Work with your health care provider or diet and nutrition specialist (dietitian) to adjust your eating plan to  your individual calorie needs. This information is not intended to replace advice given to you by your health care provider. Make sure you discuss any questions you have with your health care provider. Document Released: 02/12/2011 Document Revised: 02/17/2016 Document Reviewed: 02/17/2016 Elsevier Interactive Patient Education  2017 [employer].

## 2016-05-28 ENCOUNTER — Telehealth: Payer: Self-pay

## 2016-05-28 DIAGNOSIS — Z3009 Encounter for other general counseling and advice on contraception: Secondary | ICD-10-CM

## 2016-05-28 DIAGNOSIS — Z139 Encounter for screening, unspecified: Secondary | ICD-10-CM

## 2016-05-28 LAB — VITAMIN D 25 HYDROXY (VIT D DEFICIENCY, FRACTURES): VIT D 25 HYDROXY: 32 ng/mL (ref 30–100)

## 2016-05-28 LAB — HEMOGLOBIN A1C
HEMOGLOBIN A1C: 5.8 % — AB (ref <5.7)
Mean Plasma Glucose: 120 mg/dL

## 2016-05-28 NOTE — Telephone Encounter (Signed)
See If it can be added to yesterdays lab

## 2016-05-28 NOTE — Telephone Encounter (Signed)
done

## 2016-05-28 NOTE — Telephone Encounter (Signed)
Called asking for hep c testing

## 2016-05-29 ENCOUNTER — Encounter: Payer: Self-pay | Admitting: Family Medicine

## 2016-05-29 LAB — HEPATITIS C ANTIBODY: HCV Ab: NEGATIVE

## 2016-06-04 ENCOUNTER — Telehealth: Payer: Self-pay

## 2016-06-04 ENCOUNTER — Ambulatory Visit: Payer: Self-pay

## 2016-06-04 VITALS — BP 158/88 | HR 84

## 2016-06-04 DIAGNOSIS — I10 Essential (primary) hypertension: Secondary | ICD-10-CM

## 2016-06-04 NOTE — Telephone Encounter (Signed)
Spoke to Deb

## 2016-06-04 NOTE — Telephone Encounter (Signed)
Pt in for a b/p ck this am = 158 88  , pulse 80.  Has been taking 12.5 mg hctz since 3 21 18. Was asking if she should change dose. Also states she has been walking daily as advised.

## 2016-06-04 NOTE — Telephone Encounter (Signed)
This is fine.  See me in November

## 2016-06-04 NOTE — Telephone Encounter (Signed)
Spoke to [employer], aware of dr [employer] advice.

## 2017-02-04 ENCOUNTER — Encounter: Payer: Self-pay | Admitting: Family Medicine

## 2017-05-17 ENCOUNTER — Encounter: Payer: Self-pay | Admitting: Family Medicine

## 2017-06-24 ENCOUNTER — Emergency Department (HOSPITAL_COMMUNITY): Payer: Worker's Compensation

## 2017-06-24 ENCOUNTER — Encounter (HOSPITAL_COMMUNITY): Payer: Self-pay | Admitting: *Deleted

## 2017-06-24 ENCOUNTER — Other Ambulatory Visit: Payer: Self-pay

## 2017-06-24 ENCOUNTER — Emergency Department (HOSPITAL_COMMUNITY)
Admission: EM | Admit: 2017-06-24 | Discharge: 2017-06-24 | Disposition: A | Discharge status: Home / Self Care | Payer: Worker's Compensation | Attending: Emergency Medicine | Admitting: Emergency Medicine

## 2017-06-24 DIAGNOSIS — Y939 Activity, unspecified: Secondary | ICD-10-CM | POA: Diagnosis not present

## 2017-06-24 DIAGNOSIS — I10 Essential (primary) hypertension: Secondary | ICD-10-CM | POA: Diagnosis not present

## 2017-06-24 DIAGNOSIS — S52502A Unspecified fracture of the lower end of left radius, initial encounter for closed fracture: Secondary | ICD-10-CM

## 2017-06-24 DIAGNOSIS — J449 Chronic obstructive pulmonary disease, unspecified: Secondary | ICD-10-CM | POA: Insufficient documentation

## 2017-06-24 DIAGNOSIS — Z79899 Other long term (current) drug therapy: Secondary | ICD-10-CM | POA: Insufficient documentation

## 2017-06-24 DIAGNOSIS — S52615A Nondisplaced fracture of left ulna styloid process, initial encounter for closed fracture: Secondary | ICD-10-CM | POA: Diagnosis not present

## 2017-06-24 DIAGNOSIS — Y929 Unspecified place or not applicable: Secondary | ICD-10-CM | POA: Diagnosis not present

## 2017-06-24 DIAGNOSIS — S59912A Unspecified injury of left forearm, initial encounter: Secondary | ICD-10-CM | POA: Diagnosis present

## 2017-06-24 DIAGNOSIS — S52532A Colles' fracture of left radius, initial encounter for closed fracture: Secondary | ICD-10-CM | POA: Insufficient documentation

## 2017-06-24 DIAGNOSIS — Y999 Unspecified external cause status: Secondary | ICD-10-CM | POA: Insufficient documentation

## 2017-06-24 DIAGNOSIS — X58XXXA Exposure to other specified factors, initial encounter: Secondary | ICD-10-CM | POA: Diagnosis not present

## 2017-06-24 DIAGNOSIS — S52612A Displaced fracture of left ulna styloid process, initial encounter for closed fracture: Secondary | ICD-10-CM

## 2017-06-24 DIAGNOSIS — E119 Type 2 diabetes mellitus without complications: Secondary | ICD-10-CM | POA: Diagnosis not present

## 2017-06-24 MED ORDER — IBUPROFEN 800 MG PO TABS
800.0000 mg | ORAL_TABLET | Freq: Once | ORAL | Status: AC
  Administered 2017-06-24: 800 mg via ORAL
  Filled 2017-06-24: qty 1

## 2017-06-24 MED ORDER — HYDROCODONE-ACETAMINOPHEN 5-325 MG PO TABS: ORAL_TABLET | ORAL | 0 refills | Status: DC

## 2017-06-24 MED ORDER — ACETAMINOPHEN 325 MG PO TABS
650.0000 mg | ORAL_TABLET | Freq: Once | ORAL | Status: AC
Start: 2017-06-24 — End: 2017-06-24
  Administered 2017-06-24: 650 mg via ORAL
  Filled 2017-06-24: qty 2

## 2017-06-24 NOTE — ED Triage Notes (Signed)
Fell at work, pain to left wrist

## 2017-06-24 NOTE — Discharge Instructions (Addendum)
You have 2 broken bones in your wrist known as the radius and the ulnar.  Please keep your splint clean and dry.  Please use your sling until seen by the orthopedic specialist.  Apply ice over the next few days.  Use 600 mg of ibuprofen and extra strength Tylenol every 6 hours for mild pain, use Norco for more severe pain. This medication may cause drowsiness. Please do not drink, drive, or participate in activity that requires concentration while taking this medication.  Please see Dr.Varkey as soon as possible for orthopedic management of your fractures, or the orthopedic specialist of your choice.

## 2017-06-24 NOTE — ED Provider Notes (Signed)
[facility] Provider Note   CSN: [id number] Arrival date & time: 06/24/17  1620     History   Chief Complaint Chief Complaint  Patient presents with  . Wrist Injury    HPI DNASIA GAUNA is a 67 y.o. female.  The history is provided by the patient.  Wrist Injury   The incident occurred 1 to 2 hours ago. The incident occurred at work. The injury mechanism was a fall. The pain is present in the left wrist. The quality of the pain is described as throbbing. The pain is moderate. The pain has been constant since the incident. Pertinent negatives include no fever and no malaise/fatigue. She reports no foreign bodies present. The symptoms are aggravated by movement and palpation. She has tried nothing for the symptoms. The treatment provided no relief.    Past Medical History:  Diagnosis Date  . Anxiety   . Cataract   . COPD (chronic obstructive pulmonary disease) ([place])   . Diabetes mellitus without complication ([place])   . Hyperlipidemia   . Hypertension   . Sarcoidosis     Patient Active Problem List   Diagnosis Date Noted  . Vitamin D deficiency 05/27/2016  . Prediabetes 05/27/2016  . Snores 05/27/2016  . HLD (hyperlipidemia) 05/27/2016  . Immunization refused 05/27/2016  . Chest pain 05/03/2016  . Sarcoidosis   . Essential hypertension   . COPD (chronic obstructive pulmonary disease) ([place])     Past Surgical History:  Procedure Laterality Date  . TUBAL LIGATION       OB History   None      Home Medications    Prior to Admission medications   Medication Sig Start Date End Date Taking? Authorizing Provider  Ascorbic Acid (VITAMIN C) 1000 MG tablet Take 2,000 mg by mouth daily.    [provider]  Capsicum, Cayenne, (CAYENNE PO) Take 1 tablet by mouth daily as needed (blood pressure).    [provider]  Coenzyme Q10 (CO Q-10 PO) Take by mouth.    [provider]  Cyanocobalamin (VITAMIN B 12 PO) Take 1 tablet  by mouth daily.    [provider]  EYEBRIGHT HERB PO Take 2 tablets by mouth daily.    [provider]  hydrochlorothiazide (MICROZIDE) 12.5 MG capsule Take 1 capsule (12.5 mg total) by mouth daily. 05/27/16   Raylene Everts, MD  ibuprofen (ADVIL,MOTRIN) 200 MG tablet Take 200-400 mg by mouth every 6 (six) hours as needed for moderate pain.    [provider]  Magnesium Gluconate POWD Take 400 mg by mouth daily.    [provider]  Omega-3 Fatty Acids (FISH OIL PO) Take 1 capsule by mouth daily.    [provider]    Family History Family History  Problem Relation Age of Onset  . COPD Mother   . Diabetes Mother   . Hepatitis C Mother   . Cancer Father        lung  . Alcohol abuse Father   . Drug abuse Son 32       overdose  . Drug abuse Son   . Hepatitis C Son   . Cancer Maternal Aunt     Social History Social History   Tobacco Use  . Smoking status: Never Smoker  . Smokeless tobacco: Never Used  Substance Use Topics  . Alcohol use: Yes    Comment: occ  . Drug use: No     Allergies   Patient  has no known allergies.   Review of Systems Review of Systems  Constitutional: Negative for activity change, fever and malaise/fatigue.       All ROS Neg except as noted in HPI  HENT: Negative for nosebleeds.   Eyes: Negative for photophobia and discharge.  Respiratory: Negative for cough, shortness of breath and wheezing.   Cardiovascular: Negative for chest pain and palpitations.  Gastrointestinal: Negative for abdominal pain and blood in stool.  Genitourinary: Negative for dysuria, frequency and hematuria.  Musculoskeletal: Positive for arthralgias. Negative for back pain and neck pain.  Skin: Negative.   Neurological: Negative for dizziness, seizures and speech difficulty.  Psychiatric/Behavioral: Negative for confusion and hallucinations.     Physical Exam Updated Vital Signs BP (!) 196/112   Pulse (!) 113   Temp  98.5 F (36.9 C) (Oral)   Resp 20   Ht 5\' 3"  (1.6 m)   Wt 89.8 kg (198 lb)   SpO2 95%   BMI 35.07 kg/m   Physical Exam  Constitutional: She is oriented to person, place, and time. She appears well-developed and well-nourished.  Non-toxic appearance.  HENT:  Head: Normocephalic.  Right Ear: Tympanic membrane and external ear normal.  Left Ear: Tympanic membrane and external ear normal.  Patient states she may have scraped her nose, but did not hit her head.  There was no loss of consciousness reported.  No palpable hematoma appreciated about the scalp or forehead.  Eyes: Pupils are equal, round, and reactive to light. EOM and lids are normal.  Neck: Normal range of motion. Neck supple. Carotid bruit is not present.  Cardiovascular: Normal rate, regular rhythm, normal heart sounds, intact distal pulses and normal pulses.  Pulmonary/Chest: Breath sounds normal. No respiratory distress.  There is symmetrical rise and fall of the chest.  The patient speaks in complete sentences without problem.  There are no chest wall tenderness appreciated on examination.  Abdominal: Soft. Bowel sounds are normal. There is no tenderness. There is no guarding.  Musculoskeletal: She exhibits tenderness.       Left shoulder: Normal.       Left elbow: Normal.       Left wrist: She exhibits decreased range of motion, tenderness, bony tenderness, swelling and deformity.  Lymphadenopathy:       Head (right side): No submandibular adenopathy present.       Head (left side): No submandibular adenopathy present.    She has no cervical adenopathy.  Neurological: She is alert and oriented to person, place, and time. She has normal strength. No cranial nerve deficit or sensory deficit.  Skin: Skin is warm and dry.  Psychiatric: She has a normal mood and affect. Her speech is normal.  Nursing note and vitals reviewed.    ED Treatments / Results  Labs (all labs ordered are listed, but only abnormal results are  displayed) Labs Reviewed - No data to display  EKG None  Radiology No results found. FRACTURE CARE PROVIDED. Procedures .Splint Application Date/Time: 7/56/4332 5:58 PM Performed by: Lily Kocher, PA-C Authorized by: Lily Kocher, PA-C   Consent:    Consent obtained:  Verbal   Consent given by:  Patient   Risks discussed:  Discoloration, numbness, pain and swelling Universal protocol:    Procedure explained and questions answered to patient or proxy's satisfaction: yes     Immediately prior to procedure a time out was called: yes     Patient identity confirmed:  Arm band Pre-procedure details:    Sensation:  Normal Procedure details:    Laterality:  Left   Location:  Wrist   Wrist:  L wrist   Splint type:  Sugar tong   Supplies:  Sling, Ortho-Glass and cotton padding Post-procedure details:    Pain:  Improved   Sensation:  Normal   Skin color:  Normal   Patient tolerance of procedure:  Tolerated well, no immediate complications   (including critical care time)    Medications Ordered in ED Medications  ibuprofen (ADVIL,MOTRIN) tablet 800 mg (has no administration in time range)  acetaminophen (TYLENOL) tablet 650 mg (has no administration in time range)     Initial Impression / Assessment and Plan / ED Course  I have reviewed the triage vital signs and the nursing notes.  Pertinent labs & imaging results that were available during my care of the patient were reviewed by me and considered in my medical decision making (see chart for details).       Final Clinical Impressions(s) / ED Diagnoses MDM Blood pressure is elevated at 196/112 on admission to the emergency department.  The heart rate is also elevated on admission to the emergency department at 113/min.  Vital signs are otherwise within normal limits.  The patient has good range of motion of the left shoulder and elbow.  There is pain and some deformity of the left wrist.  X-rays pending.  Pain  medication offered.  Patient states that she would like to use Tylenol and/or ibuprofen for now.  Tylenol and ibuprofen as well as an ice pack provided to the patient.  X-ray of the left wrist shows a Colles' fracture of the distal radius with comminution and slight dorsal angulation.  There is also a nondisplaced ulnar styloid fracture.  I discussed the fracture in the need for mobilization with the patient in terms which she understands.  The patient gives permission for the immobilization process.  Patient fitted with a sugar tong splint and sling.  Prescription given for medication for pain.  Patient is referred to orthopedics.  Patient is in agreement with this plan.     Final diagnoses:  Closed fracture of distal end of left radius, unspecified fracture morphology, initial encounter  Traumatic closed fracture of ulnar styloid with minimal displacement, left, initial encounter    ED Discharge Orders        Ordered    HYDROcodone-acetaminophen (NORCO/VICODIN) 5-325 MG tablet     06/24/17 1833       Lily Kocher, PA-C 06/26/17 1626    Nat Christen, MD 06/27/17 [phone]

## 2017-06-28 ENCOUNTER — Other Ambulatory Visit: Payer: Self-pay | Admitting: Family Medicine

## 2017-06-29 ENCOUNTER — Encounter (HOSPITAL_COMMUNITY): Payer: Self-pay | Admitting: *Deleted

## 2017-06-29 ENCOUNTER — Other Ambulatory Visit: Payer: Self-pay

## 2017-06-29 NOTE — Progress Notes (Signed)
   06/29/17 1549  OBSTRUCTIVE SLEEP APNEA  Have you ever been diagnosed with sleep apnea through a sleep study? No  Do you snore loudly (loud enough to be heard through closed doors)?  1  Do you often feel tired, fatigued, or sleepy during the daytime (such as falling asleep during driving or talking to someone)? 0  Has anyone observed you stop breathing during your sleep? 1  Do you have, or are you being treated for high blood pressure? 1  BMI more than 35 kg/m2? 1  Age > 50 (1-yes) 1  Neck circumference greater than:Female 16 inches or larger, Female 17inches or larger?  (assess dos)  Female Gender (Yes=1) 0  Obstructive Sleep Apnea Score 5

## 2017-06-29 NOTE — H&P (Signed)
PREOPERATIVE H&P  Chief Complaint: LEFT WRIST FRACTURE  HPI: Emma Russell is a 67 y.o. female who presents for preoperative history and physical with a diagnosis of LEFT WRIST FRACTURE. Symptoms are rated as moderate to severe, and have been worsening.  This is significantly impairing activities of daily living.  She has elected for surgical management.   Past Medical History:  Diagnosis Date  . Anxiety   . Cataract   . COPD (chronic obstructive pulmonary disease) ([place])   . Diabetes mellitus without complication ([place])    denies  . GERD (gastroesophageal reflux disease)   . Headache   . Hyperlipidemia   . Hypertension   . Incontinence of urine   . Pre-diabetes   . Sarcoidosis    Past Surgical History:  Procedure Laterality Date  . TUBAL LIGATION     Social History   Socioeconomic History  . Marital status: Widowed    Spouse name: Not on file  . Number of children: 4  . Years of education: 2  . Highest education level: Not on file  Occupational History  . Occupation: Freight forwarder    Comment: Technical brewer  Social Needs  . Financial resource strain: Not on file  . Food insecurity:    Worry: Not on file    Inability: Not on file  . Transportation needs:    Medical: Not on file    Non-medical: Not on file  Tobacco Use  . Smoking status: Never Smoker  . Smokeless tobacco: Never Used  Substance and Sexual Activity  . Alcohol use: Yes    Comment: occ  . Drug use: No  . Sexual activity: Not Currently  Lifestyle  . Physical activity:    Days per week: Not on file    Minutes per session: Not on file  . Stress: Not on file  Relationships  . Social connections:    Talks on phone: Not on file    Gets together: Not on file    Attends religious service: Not on file    Active member of club or organization: Not on file    Attends meetings of clubs or organizations: Not on file    Relationship status: Not on file  Other Topics Concern  . Not on file  Social History  Narrative   Lives at home with grandson Mckinley Jewel has ADHD and anxiety issues   His father Dellis Filbert is addicted and comes and goes   Is a widow   Family History  Problem Relation Age of Onset  . COPD Mother   . Diabetes Mother   . Hepatitis C Mother   . Cancer Father        lung  . Alcohol abuse Father   . Drug abuse Son 32       overdose  . Drug abuse Son   . Hepatitis C Son   . Cancer Maternal Aunt    Allergies  Allergen Reactions  . Codeine Nausea And Vomiting   Prior to Admission medications   Medication Sig Start Date End Date Taking? Authorizing Provider  Ascorbic Acid (VITAMIN C) 1000 MG tablet Take 2,000 mg by mouth daily.   Yes [provider]  Cyanocobalamin (VITAMIN B 12 PO) Take 1 tablet by mouth daily.   Yes [provider]  ibuprofen (ADVIL,MOTRIN) 200 MG tablet Take 200-400 mg by mouth every 6 (six) hours as needed for moderate pain.   Yes [provider]  Magnesium Gluconate POWD Take 400 mg  by mouth daily.   Yes [provider]     Positive ROS: All other systems have been reviewed and were otherwise negative with the exception of those mentioned in the HPI and as above.  Physical Exam: General: Alert, no acute distress Cardiovascular: No pedal edema Respiratory: No cyanosis, no use of accessory musculature GI: No organomegaly, abdomen is soft and non-tender Skin: No lesions in the area of chief complaint Neurologic: Sensation intact distally Psychiatric: Patient is competent for consent with normal mood and affect Lymphatic: No axillary or cervical lymphadenopathy  MUSCULOSKELETAL: L distal radius fracture, distal motor and sensory presereved, skin intact.    Assessment: LEFT WRIST FRACTURE  Plan: Plan for Procedure(s): OPEN REDUCTION INTERNAL FIXATION (ORIF) WRIST FRACTURE  The risks benefits and alternatives were discussed with the patient including but not limited to the risks of nonoperative  treatment, versus surgical intervention including infection, bleeding, nerve injury,  blood clots, cardiopulmonary complications, morbidity, mortality, among others, and they were willing to proceed.   Hiram Gash, MD  06/29/2017 3:52 PM

## 2017-06-29 NOTE — Progress Notes (Signed)
Pt denies SOB, chest pain, and being under the care of a cardiologist. Pt denies having a stress test and cardiac cath. Pt stated that she does not have a PCP. Pt stated that she has COPD but " does not see anyone for it." Pt denies having diabetes but stated that she has pre-diabetes. Pt denies having a chest x ray and EKG within the last year. Pt made aware to stop taking Aspirin (unless instructed otherwise by surgeon), vitamins, fish oil and herbal medications. Do not take any NSAIDs ie: Ibuprofen, Advil, Naproxen (Aleve), Motrin, BC and Goody Powder. Pt verbalized understanding of all pre-op instructions.

## 2017-06-30 ENCOUNTER — Ambulatory Visit (HOSPITAL_COMMUNITY): Payer: Worker's Compensation | Admitting: Anesthesiology

## 2017-06-30 ENCOUNTER — Ambulatory Visit (HOSPITAL_COMMUNITY): Payer: Worker's Compensation

## 2017-06-30 ENCOUNTER — Encounter (HOSPITAL_COMMUNITY)
Admission: RE | Disposition: A | Discharge status: Home / Self Care | Payer: Self-pay | Source: Ambulatory Visit | Attending: Orthopaedic Surgery

## 2017-06-30 ENCOUNTER — Ambulatory Visit (HOSPITAL_COMMUNITY)
Admission: RE | Admit: 2017-06-30 | Discharge: 2017-06-30 | Disposition: A | Discharge status: Home / Self Care | Payer: Worker's Compensation | Source: Ambulatory Visit | Attending: Orthopaedic Surgery | Admitting: Orthopaedic Surgery

## 2017-06-30 DIAGNOSIS — D869 Sarcoidosis, unspecified: Secondary | ICD-10-CM | POA: Insufficient documentation

## 2017-06-30 DIAGNOSIS — I10 Essential (primary) hypertension: Secondary | ICD-10-CM | POA: Diagnosis not present

## 2017-06-30 DIAGNOSIS — Z833 Family history of diabetes mellitus: Secondary | ICD-10-CM | POA: Insufficient documentation

## 2017-06-30 DIAGNOSIS — R32 Unspecified urinary incontinence: Secondary | ICD-10-CM | POA: Diagnosis not present

## 2017-06-30 DIAGNOSIS — R51 Headache: Secondary | ICD-10-CM | POA: Insufficient documentation

## 2017-06-30 DIAGNOSIS — E119 Type 2 diabetes mellitus without complications: Secondary | ICD-10-CM | POA: Diagnosis not present

## 2017-06-30 DIAGNOSIS — Z09 Encounter for follow-up examination after completed treatment for conditions other than malignant neoplasm: Secondary | ICD-10-CM

## 2017-06-30 DIAGNOSIS — W19XXXA Unspecified fall, initial encounter: Secondary | ICD-10-CM | POA: Diagnosis not present

## 2017-06-30 DIAGNOSIS — Z811 Family history of alcohol abuse and dependence: Secondary | ICD-10-CM | POA: Insufficient documentation

## 2017-06-30 DIAGNOSIS — J449 Chronic obstructive pulmonary disease, unspecified: Secondary | ICD-10-CM | POA: Diagnosis not present

## 2017-06-30 DIAGNOSIS — K219 Gastro-esophageal reflux disease without esophagitis: Secondary | ICD-10-CM | POA: Insufficient documentation

## 2017-06-30 DIAGNOSIS — Z825 Family history of asthma and other chronic lower respiratory diseases: Secondary | ICD-10-CM | POA: Diagnosis not present

## 2017-06-30 DIAGNOSIS — S52572A Other intraarticular fracture of lower end of left radius, initial encounter for closed fracture: Secondary | ICD-10-CM | POA: Diagnosis present

## 2017-06-30 DIAGNOSIS — F419 Anxiety disorder, unspecified: Secondary | ICD-10-CM | POA: Insufficient documentation

## 2017-06-30 DIAGNOSIS — Z8489 Family history of other specified conditions: Secondary | ICD-10-CM | POA: Insufficient documentation

## 2017-06-30 DIAGNOSIS — Y939 Activity, unspecified: Secondary | ICD-10-CM | POA: Insufficient documentation

## 2017-06-30 DIAGNOSIS — Z8379 Family history of other diseases of the digestive system: Secondary | ICD-10-CM | POA: Insufficient documentation

## 2017-06-30 DIAGNOSIS — Z809 Family history of malignant neoplasm, unspecified: Secondary | ICD-10-CM | POA: Insufficient documentation

## 2017-06-30 DIAGNOSIS — Z801 Family history of malignant neoplasm of trachea, bronchus and lung: Secondary | ICD-10-CM | POA: Diagnosis not present

## 2017-06-30 DIAGNOSIS — E785 Hyperlipidemia, unspecified: Secondary | ICD-10-CM | POA: Diagnosis not present

## 2017-06-30 DIAGNOSIS — Z79899 Other long term (current) drug therapy: Secondary | ICD-10-CM | POA: Diagnosis not present

## 2017-06-30 HISTORY — PX: ORIF WRIST FRACTURE: SHX2133

## 2017-06-30 HISTORY — DX: Unspecified urinary incontinence: R32

## 2017-06-30 HISTORY — DX: Headache, unspecified: R51.9

## 2017-06-30 HISTORY — DX: Gastro-esophageal reflux disease without esophagitis: K21.9

## 2017-06-30 HISTORY — DX: Headache: R51

## 2017-06-30 HISTORY — DX: Prediabetes: R73.03

## 2017-06-30 LAB — CBC
HCT: 45.2 % (ref 36.0–46.0)
HEMOGLOBIN: 15.4 g/dL — AB (ref 12.0–15.0)
MCH: 29.4 pg (ref 26.0–34.0)
MCHC: 34.1 g/dL (ref 30.0–36.0)
MCV: 86.3 fL (ref 78.0–100.0)
Platelets: 258 10*3/uL (ref 150–400)
RBC: 5.24 MIL/uL — ABNORMAL HIGH (ref 3.87–5.11)
RDW: 13 % (ref 11.5–15.5)
WBC: 8.4 10*3/uL (ref 4.0–10.5)

## 2017-06-30 LAB — BASIC METABOLIC PANEL
ANION GAP: 11 (ref 5–15)
BUN: 17 mg/dL (ref 6–20)
CHLORIDE: 104 mmol/L (ref 101–111)
CO2: 22 mmol/L (ref 22–32)
Calcium: 9.4 mg/dL (ref 8.9–10.3)
Creatinine, Ser: 0.85 mg/dL (ref 0.44–1.00)
GFR calc Af Amer: >60 mL/min (ref >60)
GLUCOSE: 134 mg/dL — AB (ref 65–99)
Potassium: 3.9 mmol/L (ref 3.5–5.1)
Sodium: 137 mmol/L (ref 135–145)

## 2017-06-30 LAB — GLUCOSE, CAPILLARY: Glucose-Capillary: 150 mg/dL — ABNORMAL HIGH (ref 65–99)

## 2017-06-30 LAB — HEMOGLOBIN A1C
Hgb A1c MFr Bld: 6.3 % — ABNORMAL HIGH (ref 4.8–5.6)
MEAN PLASMA GLUCOSE: 134.11 mg/dL

## 2017-06-30 SURGERY — OPEN REDUCTION INTERNAL FIXATION (ORIF) WRIST FRACTURE
Anesthesia: General | Site: Wrist | Laterality: Left

## 2017-06-30 MED ORDER — ONDANSETRON HCL 4 MG PO TABS: 4.0000 mg | ORAL_TABLET | Freq: Three times a day (TID) | ORAL | 1 refills | Status: AC | PRN

## 2017-06-30 MED ORDER — CHLORHEXIDINE GLUCONATE 4 % EX LIQD: 60.0000 mL | Freq: Once | CUTANEOUS | Status: DC

## 2017-06-30 MED ORDER — MEPERIDINE HCL 50 MG/ML IJ SOLN: 6.2500 mg | INTRAMUSCULAR | Status: DC | PRN

## 2017-06-30 MED ORDER — FENTANYL CITRATE (PF) 100 MCG/2ML IJ SOLN
INTRAMUSCULAR | Status: AC
  Administered 2017-06-30: 50 ug
  Filled 2017-06-30: qty 2

## 2017-06-30 MED ORDER — ONDANSETRON HCL 4 MG/2ML IJ SOLN
INTRAMUSCULAR | Status: AC
  Administered 2017-06-30: 4 mg via INTRAVENOUS
  Filled 2017-06-30: qty 2

## 2017-06-30 MED ORDER — MELOXICAM 7.5 MG PO TABS: 7.5000 mg | ORAL_TABLET | Freq: Every day | ORAL | 2 refills | Status: AC

## 2017-06-30 MED ORDER — FENTANYL CITRATE (PF) 100 MCG/2ML IJ SOLN
25.0000 ug | INTRAMUSCULAR | Status: DC | PRN
  Administered 2017-06-30: 25 ug via INTRAVENOUS

## 2017-06-30 MED ORDER — CEFAZOLIN SODIUM-DEXTROSE 2-4 GM/100ML-% IV SOLN
2.0000 g | INTRAVENOUS | Status: AC
  Administered 2017-06-30: 2 g via INTRAVENOUS

## 2017-06-30 MED ORDER — OMEPRAZOLE 20 MG PO CPDR: 20.0000 mg | DELAYED_RELEASE_CAPSULE | Freq: Every day | ORAL | 0 refills | Status: DC

## 2017-06-30 MED ORDER — GLYCOPYRROLATE 0.2 MG/ML IV SOSY
PREFILLED_SYRINGE | INTRAVENOUS | Status: DC | PRN
  Administered 2017-06-30: .2 mg via INTRAVENOUS
  Administered 2017-06-30: .1 mg via INTRAVENOUS

## 2017-06-30 MED ORDER — LIDOCAINE 2% (20 MG/ML) 5 ML SYRINGE
INTRAMUSCULAR | Status: AC
  Filled 2017-06-30: qty 5

## 2017-06-30 MED ORDER — FENTANYL CITRATE (PF) 100 MCG/2ML IJ SOLN
INTRAMUSCULAR | Status: DC | PRN
  Administered 2017-06-30 (×3): 25 ug via INTRAVENOUS

## 2017-06-30 MED ORDER — PROPOFOL 10 MG/ML IV BOLUS
INTRAVENOUS | Status: AC
  Filled 2017-06-30: qty 20

## 2017-06-30 MED ORDER — 0.9 % SODIUM CHLORIDE (POUR BTL) OPTIME
TOPICAL | Status: DC | PRN
  Administered 2017-06-30: 1000 mL

## 2017-06-30 MED ORDER — PROPOFOL 10 MG/ML IV BOLUS
INTRAVENOUS | Status: DC | PRN
  Administered 2017-06-30: 200 mg via INTRAVENOUS

## 2017-06-30 MED ORDER — VANCOMYCIN HCL 500 MG IV SOLR
INTRAVENOUS | Status: AC
  Filled 2017-06-30: qty 1000

## 2017-06-30 MED ORDER — OXYCODONE HCL 5 MG PO TABS
5.0000 mg | ORAL_TABLET | Freq: Once | ORAL | Status: AC | PRN
  Administered 2017-06-30: 5 mg via ORAL

## 2017-06-30 MED ORDER — ONDANSETRON HCL 4 MG/2ML IJ SOLN
INTRAMUSCULAR | Status: AC
  Filled 2017-06-30: qty 2

## 2017-06-30 MED ORDER — ONDANSETRON HCL 4 MG/2ML IJ SOLN
4.0000 mg | Freq: Once | INTRAMUSCULAR | Status: AC | PRN
  Administered 2017-06-30: 4 mg via INTRAVENOUS

## 2017-06-30 MED ORDER — FENTANYL CITRATE (PF) 250 MCG/5ML IJ SOLN
INTRAMUSCULAR | Status: AC
  Filled 2017-06-30: qty 5

## 2017-06-30 MED ORDER — OXYCODONE HCL 5 MG/5ML PO SOLN: 5.0000 mg | Freq: Once | ORAL | Status: AC | PRN

## 2017-06-30 MED ORDER — LACTATED RINGERS IV SOLN
INTRAVENOUS | Status: DC
  Administered 2017-06-30 (×2): via INTRAVENOUS

## 2017-06-30 MED ORDER — ACETAMINOPHEN 500 MG PO TABS: 1000.0000 mg | ORAL_TABLET | Freq: Three times a day (TID) | ORAL | 0 refills | Status: AC

## 2017-06-30 MED ORDER — OXYCODONE HCL 5 MG PO TABS: ORAL_TABLET | ORAL | 0 refills | Status: AC

## 2017-06-30 MED ORDER — OXYCODONE HCL 5 MG PO TABS
ORAL_TABLET | ORAL | Status: AC
  Filled 2017-06-30: qty 1

## 2017-06-30 MED ORDER — MIDAZOLAM HCL 2 MG/2ML IJ SOLN
INTRAMUSCULAR | Status: AC
  Administered 2017-06-30: 2 mg
  Filled 2017-06-30: qty 2

## 2017-06-30 MED ORDER — CEFAZOLIN SODIUM-DEXTROSE 2-4 GM/100ML-% IV SOLN
INTRAVENOUS | Status: AC
  Filled 2017-06-30: qty 100

## 2017-06-30 MED ORDER — ACETAMINOPHEN 325 MG PO TABS: 325.0000 mg | ORAL_TABLET | ORAL | Status: DC | PRN

## 2017-06-30 MED ORDER — BUPIVACAINE HCL (PF) 0.5 % IJ SOLN
INTRAMUSCULAR | Status: DC | PRN
  Administered 2017-06-30: 20 mL

## 2017-06-30 MED ORDER — ONDANSETRON HCL 4 MG/2ML IJ SOLN
INTRAMUSCULAR | Status: DC | PRN
  Administered 2017-06-30: 4 mg via INTRAVENOUS

## 2017-06-30 MED ORDER — BUPIVACAINE LIPOSOME 1.3 % IJ SUSP
INTRAMUSCULAR | Status: DC | PRN
  Administered 2017-06-30: 10 mL via PERINEURAL

## 2017-06-30 MED ORDER — VANCOMYCIN HCL 500 MG IV SOLR
INTRAVENOUS | Status: DC | PRN
  Administered 2017-06-30: 1000 mg

## 2017-06-30 MED ORDER — ACETAMINOPHEN 160 MG/5ML PO SOLN: 325.0000 mg | ORAL | Status: DC | PRN

## 2017-06-30 MED ORDER — FENTANYL CITRATE (PF) 100 MCG/2ML IJ SOLN
INTRAMUSCULAR | Status: AC
  Filled 2017-06-30: qty 2

## 2017-06-30 SURGICAL SUPPLY — 60 items
ALCOHOL 70% 16 OZ (MISCELLANEOUS) ×2 IMPLANT
BANDAGE ACE 4X5 VEL STRL LF (GAUZE/BANDAGES/DRESSINGS) ×2 IMPLANT
BIT DRILL 2.2 SS TIBIAL (BIT) ×2 IMPLANT
BLADE CLIPPER SURG (BLADE) IMPLANT
BNDG ESMARK 4X9 LF (GAUZE/BANDAGES/DRESSINGS) ×2 IMPLANT
CANISTER SUCTION WELLS/JOHNSON (MISCELLANEOUS) ×2 IMPLANT
CLSR STERI-STRIP ANTIMIC 1/2X4 (GAUZE/BANDAGES/DRESSINGS) ×2 IMPLANT
CORD BIPOLAR FORCEPS 12FT (ELECTRODE) IMPLANT
COVER SURGICAL LIGHT HANDLE (MISCELLANEOUS) ×2 IMPLANT
CUFF TOURNIQUET SINGLE 18IN (TOURNIQUET CUFF) IMPLANT
CUFF TOURNIQUET SINGLE 24IN (TOURNIQUET CUFF) ×2 IMPLANT
DECANTER SPIKE VIAL GLASS SM (MISCELLANEOUS) IMPLANT
DRAPE OEC MINIVIEW 54X84 (DRAPES) ×2 IMPLANT
DRAPE SURG 17X23 STRL (DRAPES) ×2 IMPLANT
DRSG PAD ABDOMINAL 8X10 ST (GAUZE/BANDAGES/DRESSINGS) ×2 IMPLANT
ELECT REM PT RETURN 9FT ADLT (ELECTROSURGICAL) ×2
ELECTRODE REM PT RTRN 9FT ADLT (ELECTROSURGICAL) ×1 IMPLANT
GAUZE SPONGE 4X4 12PLY STRL (GAUZE/BANDAGES/DRESSINGS) ×2 IMPLANT
GAUZE XEROFORM 1X8 LF (GAUZE/BANDAGES/DRESSINGS) ×2 IMPLANT
GLOVE BIOGEL PI IND STRL 8 (GLOVE) ×1 IMPLANT
GLOVE BIOGEL PI INDICATOR 8 (GLOVE) ×1
GLOVE ECLIPSE 8.0 STRL XLNG CF (GLOVE) ×4 IMPLANT
GOWN STRL REUS W/ TWL LRG LVL3 (GOWN DISPOSABLE) ×1 IMPLANT
GOWN STRL REUS W/ TWL XL LVL3 (GOWN DISPOSABLE) ×1 IMPLANT
GOWN STRL REUS W/TWL LRG LVL3 (GOWN DISPOSABLE) ×1
GOWN STRL REUS W/TWL XL LVL3 (GOWN DISPOSABLE) ×1
K-WIRE 1.6 (WIRE) ×2
K-WIRE FX5X1.6XNS BN SS (WIRE) ×2
KIT BASIN OR (CUSTOM PROCEDURE TRAY) ×2 IMPLANT
KIT TURNOVER KIT B (KITS) ×2 IMPLANT
KWIRE FX5X1.6XNS BN SS (WIRE) ×2 IMPLANT
NEEDLE HYPO 25GX1X1/2 BEV (NEEDLE) IMPLANT
NS IRRIG 1000ML POUR BTL (IV SOLUTION) ×2 IMPLANT
PACK ORTHO EXTREMITY (CUSTOM PROCEDURE TRAY) ×2 IMPLANT
PAD ARMBOARD 7.5X6 YLW CONV (MISCELLANEOUS) ×4 IMPLANT
PAD CAST 4YDX4 CTTN HI CHSV (CAST SUPPLIES) ×1 IMPLANT
PADDING CAST COTTON 4X4 STRL (CAST SUPPLIES) ×1
PEG LOCKING SMOOTH 2.2X20 (Screw) ×6 IMPLANT
PEG LOCKING SMOOTH 2.2X22 (Screw) ×4 IMPLANT
SCREW LOCK 14X2.7X 3 LD TPR (Screw) ×2 IMPLANT
SCREW LOCK 22X2.7X 3 LD TPR (Screw) ×1 IMPLANT
SCREW LOCKING 2.7X14 (Screw) ×2 IMPLANT
SCREW LOCKING 2.7X15MM (Screw) ×2 IMPLANT
SCREW LOCKING 2.7X22MM (Screw) ×1 IMPLANT
SCREW LP 3.5 (Screw) ×2 IMPLANT
SCREW LP NL 2.7X22MM (Screw) ×2 IMPLANT
SOAP 2 % CHG 4 OZ (WOUND CARE) ×2 IMPLANT
SPLINT FIBERGLASS 3X12 (CAST SUPPLIES) ×2 IMPLANT
SPONGE LAP 4X18 X RAY DECT (DISPOSABLE) IMPLANT
SUT MNCRL AB 3-0 PS2 18 (SUTURE) ×2 IMPLANT
SUT MNCRL AB 4-0 PS2 18 (SUTURE) ×2 IMPLANT
SUT PROLENE 3 0 PS 2 (SUTURE) ×2 IMPLANT
SUT VIC AB 3-0 CT1 27 (SUTURE) ×1
SUT VIC AB 3-0 CT1 TAPERPNT 27 (SUTURE) ×1 IMPLANT
SYR CONTROL 10ML LL (SYRINGE) IMPLANT
TOWEL OR 17X24 6PK STRL BLUE (TOWEL DISPOSABLE) ×2 IMPLANT
TOWEL OR 17X26 10 PK STRL BLUE (TOWEL DISPOSABLE) ×2 IMPLANT
TUBE CONNECTING 12X1/4 (SUCTIONS) ×2 IMPLANT
WATER STERILE IRR 1000ML POUR (IV SOLUTION) IMPLANT
YANKAUER SUCT BULB TIP NO VENT (SUCTIONS) IMPLANT

## 2017-06-30 NOTE — Op Note (Signed)
Orthopaedic Surgery Operative Note (CSN: [id number])  Emma Russell  May 20, 1950 Date of Surgery: 06/30/2017   Diagnoses:  Left displaced dorsally angulated distal radius fracture  Procedure: ORIF Left intraarticular distal radius fracture >3 pieces - 25609   Operative Finding Successful completion of planned procedure.  Significant comminution with intraarticular split into lunate facet.  Good fixation.  Post-operative plan: The patient will be NWB in splint.  The patient will be dc home with clarification from anesthesia that they had no concerns at end of case.  DVT prophylaxis not indicated in isolated upper extremity surgery patient with no specific risks factors.  Pain control with PRN pain medication preferring oral medicines.  Follow up plan will be scheduled in approximately 7 days for incision check and XR.  Post-Op Diagnosis: Same Surgeons:Primary: Hiram Gash, MD Assistants: None Location: [facility] OR ROOM 03 Anesthesia: General Antibiotics: Ancef 2g preop, Vancomycin 1000mg  locally  Tourniquet time:  Total Tourniquet Time Documented: Upper Arm (Left) - 34 minutes Total: Upper Arm (Left) - 34 minutes  Estimated Blood Loss: minimal Complications: None Specimens: None Implants: Implant Name Type Inv. Item Serial No. Manufacturer Lot No. LRB No. Used Action  SCREW LP 3.5 - [id number] Screw SCREW LP 3.5  [employer](ORTH,TRAU,BIO,SG)  Left 1 Implanted  PEG LOCKING SMOOTH 2.2X20 - [id number] Screw PEG LOCKING SMOOTH 2.2X20  [employer](ORTH,TRAU,BIO,SG)  Left 3 Implanted  PEG LOCKING SMOOTH 2.2X22 - [id number] Screw PEG LOCKING SMOOTH 2.2X22  [employer](ORTH,TRAU,BIO,SG)  Left 2 Implanted  SCREW LOCKING 2.7X14 - [id number] Screw SCREW LOCKING 2.7X14  [employer](ORTH,TRAU,BIO,SG)  Left 2 Implanted  SCREW LOCKING 2.7X15MM - [id number] Screw SCREW LOCKING 2.7X15MM  [employer](ORTH,TRAU,BIO,SG)  Left 1 Implanted  SCREW LOCKING 2.7X22MM - [id number] Screw SCREW LOCKING 2.7X22MM   [employer](ORTH,TRAU,BIO,SG)  Left 1 Implanted    Indications for Surgery:   Emma Russell is a 67 y.o. female with fall resulting in above injury.  Patient had significant dorsal comminution, 20 degrees dorsal tilt and loss of radial incliniation and she is working and requires full use of her hands.  Benefits and risks of operative and nonoperative management were discussed prior to surgery with patient/guardian(s) and informed consent form was completed.  Specific risks including infection, need for additional surgery, CRPS, nerve and vessel injury, stiffness, postop OA.    Procedure:   The patient was identified in the preoperative holding area where the surgical site was marked. The patient was taken to the OR where a procedural timeout was called and the above noted anesthesia was induced.  The patient was positioned supine on hand table.  Preoperative antibiotics were dosed.  The patient's left wrist was prepped and draped in the usual sterile fashion.  A second preoperative timeout was called.      A tourniquet was used for the above listed time.  An FCR approach was made exposing the volar surface of the distal radius taking care to go through the sheath of the FCR tendon tract and ulnarly exposing the inferior portion of the sheath while protecting the median nerve and radial artery on each side with blunt retractors.  This inferior portion of the sheath was incised sharply and examined for presence of the palmar cutaneous branch of the median nerve.  It was determined to not be within the field and we carried our dissection deeply to the bone splitting the pronator quadratus and exposing the fracture site.    Appropriate reduction was obtained and a narrow left DVR Biomet plate  was placed and checked for sizing and reduction under fluoroscopy.  This reduction was held in place with K wires and a K wire was placed into the radial styloid.    Once appropriate reduction was confirmed we  then proceeded to fix the plate proximally and then proceeded to fill the distal holes with a combination of partially threaded screws and pegs.  At this point we checked our reduction to ensure that there was no intra-articular extension of our screws.  Once this was confirmed we proceeded to fill remaining 3 proximal shaft screws and obtained final images which demonstrated appropriate reduction and maintenance of alignment.  The DRUJ was checked and found to be stable.  We verified that all fast guides were removed on XR and through count.    The wound was thoroughly irrigated.  The tourniquet was released prior to skin closure to verify there was no excessive bleeding and we visualized that the radial artery and median nerve were intact at the end of the case. The PQ was reapproximated grossly prior to skin closure.     The incision was thoroughly irrigated and closed in a multilayer fashion with absorbable sutures. A sterile dressing was placed.  A small demisplint was placed volarly. The patient was awoken from general anesthesia and taken to the PACU in stable condition without complication.  Anesthesia confirmed that they felt she was safe for DC home postop.

## 2017-06-30 NOTE — Discharge Instructions (Signed)

## 2017-06-30 NOTE — Anesthesia Procedure Notes (Signed)
Procedure Name: LMA Insertion Date/Time: 06/30/2017 10:47 AM Performed by: Gayland Curry, CRNA Pre-anesthesia Checklist: Patient identified, Emergency Drugs available, Suction available, Patient being monitored and Timeout performed Patient Re-evaluated:Patient Re-evaluated prior to induction Oxygen Delivery Method: Circle system utilized Preoxygenation: Pre-oxygenation with 100% oxygen Induction Type: IV induction LMA: LMA inserted LMA Size: 4.0 Number of attempts: 1 Placement Confirmation: positive ETCO2 and breath sounds checked- equal and bilateral Tube secured with: Tape Dental Injury: Teeth and Oropharynx as per pre-operative assessment

## 2017-06-30 NOTE — Interval H&P Note (Signed)
Discussed case, risks and benefits with patient again.  All questions answered, no change to history.  Natoria Archibald MD  

## 2017-06-30 NOTE — Anesthesia Procedure Notes (Signed)
Anesthesia Regional Block: Interscalene brachial plexus block   Pre-Anesthetic Checklist: ,, timeout performed, Correct Patient, Correct Site, Correct Laterality, Correct Procedure, Correct Position, site marked, Risks and benefits discussed,  Surgical consent,  Pre-op evaluation,  At surgeon's request and post-op pain management  Laterality: Left  Prep: chloraprep       Needles:  Injection technique: Single-shot  Needle Type: Echogenic Stimulator Needle     Needle Length: 5cm  Needle Gauge: 22     Additional Needles:   Procedures:, nerve stimulator,,, ultrasound used (permanent image in chart),,,,   Nerve Stimulator or Paresthesia:  Response: wrist, 0.45 mA,   Additional Responses:   Narrative:  Start time: 06/30/2017 9:30 AM End time: 06/30/2017 9:35 AM Injection made incrementally with aspirations every 5 mL.  Performed by: Personally  Anesthesiologist: Janeece Riggers, MD  Additional Notes: Functioning IV was confirmed and monitors were applied.  A 87mm 22ga Arrow echogenic stimulator needle was used. Sterile prep and drape,hand hygiene and sterile gloves were used. Ultrasound guidance: relevant anatomy identified, needle position confirmed, local anesthetic spread visualized around nerve(s)., vascular puncture avoided.  Image printed for medical record. Negative aspiration and negative test dose prior to incremental administration of local anesthetic. The patient tolerated the procedure well.

## 2017-06-30 NOTE — Progress Notes (Signed)
Orthopedic Tech Progress Note Patient Details:  Emma Russell 30-Jun-1950 [id number]  Ortho Devices Type of Ortho Device: Arm sling Ortho Device/Splint Location: lue Ortho Device/Splint Interventions: Application   Post Interventions Patient Tolerated: Well Instructions Provided: Care of device   Hildred Priest 06/30/2017, 3:33 PM

## 2017-06-30 NOTE — Transfer of Care (Signed)
Immediate Anesthesia Transfer of Care Note  Patient: Emma Russell  Procedure(s) Performed: OPEN REDUCTION INTERNAL FIXATION (ORIF) WRIST FRACTURE (Left Wrist)  Patient Location: PACU  Anesthesia Type:General and Regional  Level of Consciousness: awake, alert , oriented and patient cooperative  Airway & Oxygen Therapy: Patient Spontanous Breathing  Post-op Assessment: Report given to RN and Post -op Vital signs reviewed and stable  Post vital signs: Reviewed and stable  Last Vitals:  Vitals Value Taken Time  BP 159/100 06/30/2017 12:25 PM  Temp    Pulse 99 06/30/2017 12:29 PM  Resp 13 06/30/2017 12:29 PM  SpO2 93 % 06/30/2017 12:29 PM  Vitals shown include unvalidated device data.  Last Pain:  Vitals:   06/30/17 0807  TempSrc:   PainSc: 2       Patients Stated Pain Goal: 2 (42/59/56 3875)  Complications: No apparent anesthesia complications

## 2017-06-30 NOTE — Anesthesia Preprocedure Evaluation (Addendum)
Anesthesia Evaluation  Patient identified by MRN, date of birth, ID band Patient awake    Reviewed: Allergy & Precautions, H&P , NPO status , Patient's Chart, lab work & pertinent test results, reviewed documented beta blocker date and time   Airway Mallampati: III  TM Distance: >3 FB Neck ROM: full    Dental no notable dental hx. (+) Poor Dentition, Chipped, Loose, Missing, Dental Advisory Given   Pulmonary COPD,  Sarcoidosis   Pulmonary exam normal breath sounds clear to auscultation       Cardiovascular Exercise Tolerance: Good hypertension, Pt. on medications  Rhythm:regular Rate:Normal     Neuro/Psych  Headaches, Anxiety    GI/Hepatic negative GI ROS, Neg liver ROS,   Endo/Other  negative endocrine ROSdiabetes  Renal/GU negative Renal ROS  negative genitourinary   Musculoskeletal   Abdominal   Peds  Hematology negative hematology ROS (+)   Anesthesia Other Findings   Reproductive/Obstetrics negative OB ROS                            Anesthesia Physical Anesthesia Plan  ASA: II  Anesthesia Plan: General   Post-op Pain Management:  Regional for Post-op pain   Induction:   PONV Risk Score and Plan: 3 and Ondansetron, Dexamethasone and Treatment may vary due to age or medical condition  Airway Management Planned: LMA  Additional Equipment:   Intra-op Plan:   Post-operative Plan:   Informed Consent: I have reviewed the patients History and Physical, chart, labs and discussed the procedure including the risks, benefits and alternatives for the proposed anesthesia with the patient or authorized representative who has indicated his/her understanding and acceptance.   Dental Advisory Given  Plan Discussed with: CRNA, Anesthesiologist and Surgeon  Anesthesia Plan Comments:        Anesthesia Quick Evaluation

## 2017-07-01 ENCOUNTER — Encounter (HOSPITAL_COMMUNITY): Payer: Self-pay | Admitting: Orthopaedic Surgery

## 2017-07-01 NOTE — Anesthesia Postprocedure Evaluation (Signed)
Anesthesia Post Note  Patient: Emma Russell  Procedure(s) Performed: OPEN REDUCTION INTERNAL FIXATION (ORIF) WRIST FRACTURE (Left Wrist)     Patient location during evaluation: PACU Anesthesia Type: General Level of consciousness: awake and alert Pain management: pain level controlled Vital Signs Assessment: post-procedure vital signs reviewed and stable Respiratory status: spontaneous breathing, nonlabored ventilation, respiratory function stable and patient connected to nasal cannula oxygen Cardiovascular status: blood pressure returned to baseline and stable Postop Assessment: no apparent nausea or vomiting Anesthetic complications: no    Last Vitals:  Vitals:   06/30/17 1424 06/30/17 1439  BP: (!) 146/84 (!) 151/95  Pulse: 87 92  Resp: 13 18  Temp: 36.7 C   SpO2: 92% 95%    Last Pain:  Vitals:   06/30/17 1439  TempSrc:   PainSc: 0-No pain                 Emya Picado

## 2019-02-16 ENCOUNTER — Ambulatory Visit
Admission: EM | Admit: 2019-02-16 | Discharge: 2019-02-16 | Disposition: A | Discharge status: Home / Self Care | Payer: Self-pay | Attending: Emergency Medicine | Admitting: Emergency Medicine

## 2019-02-16 ENCOUNTER — Other Ambulatory Visit: Payer: Self-pay

## 2019-02-16 DIAGNOSIS — L03115 Cellulitis of right lower limb: Secondary | ICD-10-CM

## 2019-02-16 DIAGNOSIS — L03116 Cellulitis of left lower limb: Secondary | ICD-10-CM

## 2019-02-16 MED ORDER — CEFTRIAXONE SODIUM 1 G IJ SOLR
1.0000 g | Freq: Once | INTRAMUSCULAR | Status: AC
  Administered 2019-02-16: 1 g via INTRAMUSCULAR

## 2019-02-16 MED ORDER — SULFAMETHOXAZOLE-TRIMETHOPRIM 800-160 MG PO TABS: 1.0000 | ORAL_TABLET | Freq: Two times a day (BID) | ORAL | 0 refills | Status: DC

## 2019-02-16 MED ORDER — CLONIDINE HCL 0.1 MG PO TABS
0.1000 mg | ORAL_TABLET | Freq: Once | ORAL | Status: AC
  Administered 2019-02-16: 0.1 mg via ORAL

## 2019-02-16 NOTE — Discharge Instructions (Addendum)
Prescribed Bactrim DS, take as directed and to completion Continue to alternate ibuprofen and tylenol as needed for pain and fever Follow up with PCP if symptoms persists Return or go to the ED if you have any new or worsening symptoms such as increased pain, redness, swelling, discharge, high fever, night sweats, abdominal pain, etc..Marland Kitchen

## 2019-02-16 NOTE — ED Triage Notes (Signed)
Pt presents with rash to lower legs that developed about 2 weeks ago and now appears to be developing cellulitis

## 2019-02-16 NOTE — ED Provider Notes (Addendum)
[facility]    CSN: [id number] Arrival date & time: 02/16/19  1419      History   Chief Complaint Chief Complaint  Patient presents with  . Rash  . Cellulitis    HPI Emma Russell is a 68 y.o. female.   Emma Russell 68 years old female presented to the  urgent care with a complaint of dry skin, erythema, mild pain in the lower extremities x 2 weeks.  She stated her skin was dry and she was scratching it and then it became red.  She has not seen a PCP in the last 2 years.  Denies nausea vomiting, diarrhea, chest pain, chest tightness, trauma.  The history is provided by the patient. No language interpreter was used.    Past Medical History:  Diagnosis Date  . Anxiety   . Cataract   . COPD (chronic obstructive pulmonary disease) ([place])   . Diabetes mellitus without complication ([place])    denies  . GERD (gastroesophageal reflux disease)   . Headache   . Hyperlipidemia   . Hypertension   . Incontinence of urine   . Pre-diabetes   . Sarcoidosis     Patient Active Problem List   Diagnosis Date Noted  . Vitamin D deficiency 05/27/2016  . Prediabetes 05/27/2016  . Snores 05/27/2016  . HLD (hyperlipidemia) 05/27/2016  . Immunization refused 05/27/2016  . Chest pain 05/03/2016  . Sarcoidosis   . Essential hypertension   . COPD (chronic obstructive pulmonary disease) ([place])     Past Surgical History:  Procedure Laterality Date  . ORIF WRIST FRACTURE Left 06/30/2017   Procedure: OPEN REDUCTION INTERNAL FIXATION (ORIF) WRIST FRACTURE;  Surgeon: Hiram Gash, MD;  Location: [place];  Service: Orthopedics;  Laterality: Left;  . TUBAL LIGATION      OB History   No obstetric history on file.      Home Medications    Prior to Admission medications   Medication Sig Start Date End Date Taking? Authorizing Provider  Ascorbic Acid (VITAMIN C) 1000 MG tablet Take 2,000 mg by mouth daily.    [provider]  Cyanocobalamin (VITAMIN B 12 PO) Take 1  tablet by mouth daily.    [provider]  Magnesium Gluconate POWD Take 400 mg by mouth daily.    [provider]  omeprazole (PRILOSEC) 20 MG capsule Take 1 capsule (20 mg total) by mouth daily for 14 days. 06/30/17 07/14/17  Hiram Gash, MD  sulfamethoxazole-trimethoprim (BACTRIM DS) 800-160 MG tablet Take 1 tablet by mouth 2 (two) times daily for 7 days. 02/16/19 02/23/19  Emerson Monte, FNP    Family History Family History  Problem Relation Age of Onset  . COPD Mother   . Diabetes Mother   . Hepatitis C Mother   . Cancer Father        lung  . Alcohol abuse Father   . Drug abuse Son 32       overdose  . Drug abuse Son   . Hepatitis C Son   . Cancer Maternal Aunt     Social History Social History   Tobacco Use  . Smoking status: Never Smoker  . Smokeless tobacco: Never Used  Substance Use Topics  . Alcohol use: Yes    Comment: occ  . Drug use: No     Allergies   Codeine   Review of Systems Review of Systems  Constitutional: Negative.   HENT: Negative.   Eyes: Negative.  Respiratory: Negative.   Cardiovascular: Negative.   Skin: Positive for color change. Negative for wound.  Neurological: Negative.   ROS: All other are negative   Physical Exam Triage Vital Signs ED Triage Vitals  Enc Vitals Group     BP      Pulse      Resp      Temp      Temp src      SpO2      Weight      Height      Head Circumference      Peak Flow      Pain Score      Pain Loc      Pain Edu?      Excl. in [place]?    No data found.  Updated Vital Signs BP (!) 199/89   Pulse (!) 105   Temp 98 F (36.7 C)   Resp 20   SpO2 98%   Visual Acuity Right Eye Distance:   Left Eye Distance:   Bilateral Distance:    Right Eye Near:   Left Eye Near:    Bilateral Near:     Physical Exam Constitutional:      General: She is not in acute distress.    Appearance: Normal appearance. She is normal weight. She is not ill-appearing or toxic-appearing.    Cardiovascular:     Rate and Rhythm: Regular rhythm. Tachycardia present.     Pulses: Normal pulses.     Heart sounds: Normal heart sounds.  Pulmonary:     Effort: Pulmonary effort is normal. No respiratory distress.     Breath sounds: Normal breath sounds. No rales.  Musculoskeletal:     Right lower leg: 1+ Edema present.     Left lower leg: 2+ Edema present.  Skin:    General: Skin is warm and dry.     Findings: Erythema present. No signs of injury or lesion.  Neurological:     Mental Status: She is alert and oriented to person, place, and time.   02/16/19 bilateral LE   Left leg 02/16/2019     UC Treatments / Results  Labs (all labs ordered are listed, but only abnormal results are displayed) Labs Reviewed - No data to display  EKG   Radiology No results found.  Procedures Procedures (including critical care time)  Medications Ordered in UC Medications  cloNIDine (CATAPRES) tablet 0.1 mg (0.1 mg Oral Given 02/16/19 1510)  cefTRIAXone (ROCEPHIN) injection 1 g (1 g Intramuscular Given 02/16/19 1510)    Initial Impression / Assessment and Plan / UC Course  I have reviewed the triage vital signs and the nursing notes.  Pertinent labs & imaging results that were available during my care of the patient were reviewed by me and considered in my medical decision making (see chart for details).   Patient blood pressure was elevated.  Clonidine was given in office. Repeat blood pressure measurement was 199/89 at recheck. Patient was advised to go to the ED but refused and states she does not have insurance or money.  Rocephin was given in office to manage lower extremity cellulitis. Bactrim DS was prescribed. Patient was advised to take the medication as prescribed and to go to ED if symptoms get worse. Final Clinical Impressions(s) / UC Diagnoses   Final diagnoses:  Cellulitis of leg, right  Cellulitis of leg, left     Discharge Instructions     Prescribed  Bactrim DS, take as directed and to  completion Continue to alternate ibuprofen and tylenol as needed for pain and fever Follow up with PCP if symptoms persists Return or go to the ED if you have any new or worsening symptoms such as increased pain, redness, swelling, discharge, high fever, night sweats, abdominal pain, etc...     ED Prescriptions    Medication Sig Dispense Auth. Provider   sulfamethoxazole-trimethoprim (BACTRIM DS) 800-160 MG tablet Take 1 tablet by mouth 2 (two) times daily for 7 days. 14 tablet Kurstyn Larios, Darrelyn Hillock, FNP     PDMP not reviewed this encounter.   Emerson Monte, FNP 02/16/19 1543    Emerson Monte, FNP 02/16/19 1547

## 2019-02-21 ENCOUNTER — Telehealth: Payer: Self-pay

## 2019-02-21 NOTE — Telephone Encounter (Signed)
Received phone call from patient stating that she was having back pain and was concerned it may be caused by antibiotic . Provider made aware and was instructed to complete antibiotic and return for recheck once complete. Pt verbalized understanding

## 2019-02-23 ENCOUNTER — Ambulatory Visit
Admission: EM | Admit: 2019-02-23 | Discharge: 2019-02-23 | Disposition: A | Discharge status: Home / Self Care | Payer: Self-pay | Attending: Emergency Medicine | Admitting: Emergency Medicine

## 2019-02-23 DIAGNOSIS — R238 Other skin changes: Secondary | ICD-10-CM

## 2019-02-23 DIAGNOSIS — R030 Elevated blood-pressure reading, without diagnosis of hypertension: Secondary | ICD-10-CM

## 2019-02-23 MED ORDER — LISINOPRIL 20 MG PO TABS: 20.0000 mg | ORAL_TABLET | Freq: Every day | ORAL | 2 refills | Status: DC

## 2019-02-23 NOTE — Discharge Instructions (Signed)
Lower extremity rash improved.  Symptoms may be related to underlying vascular disease You may use compression stockings to help with circulation Blood pressure elevated in office We will start you on blood pressure medication today since you are unable to get in with PCP Please continue to monitor blood pressure at home and keep a log Eat a well balanced diet of fruits, vegetables and lean meats.  Avoid foods high in fat and salt Drink water.  At least half your body weight in ounces Exercise for at least 30 minutes daily Lisinopril sent in.  Take as directed Follow up with PCP for further evaluation and management of elevated blood pressure and lower extremity skin changes Return or go to the ED if you have any new or worsening symptoms such as fever, chills, nausea, vomiting, vision changes, fatigue, dizziness, chest pain, shortness of breath, swelling in your hands or feet, urinary symptoms, etc..Marland Kitchen

## 2019-02-23 NOTE — ED Triage Notes (Signed)
Pt presents to have cellulitis rechecked on bilateral lower extremities. Pt reports the areas look better. Pt states she took her last antibiotic today. Denies any new symptoms.

## 2019-02-23 NOTE — ED Provider Notes (Signed)
[facility]   [id number] 02/23/19 Arrival Time: [id number]  CC: SKIN COMPLAINT  SUBJECTIVE:  Emma Russell is a 68 y.o. female who presents for wound check.  Seen on 02/16/2019 and diagnosed with cellulitis.  Prescribed bactrim.  Completed course of antibiotics, and reports improvement in symptoms.  Localizes the cellulitis to bilateral LEs.  Describes it as red and itchy.  Symptoms made worse with scratching.  Denies similar symptoms in the past.   Denies fever, chills, nausea, vomiting, swelling, discharge, SOB, chest pain, abdominal pain, changes in bowel or bladder function.    Patient also reports hx of high blood pressure years ago.  Was treated with a diuretic at that time.  Blood pressure elevated in office today, 187/101.  199/89 on 12/10.  Has been limiting salt intake and walking at home.  Tried to make an appt with Dr. Holly Bodily, but unable to be seen until March of 2021.  Denies HA, vision changes, dizziness, lightheadedness, chest pain, shortness of breath, numbness or tingling in extremities, abdominal pain, changes in bowel or bladder habits.    ROS: As per HPI.  All other pertinent ROS negative.     Past Medical History:  Diagnosis Date  . Anxiety   . Cataract   . COPD (chronic obstructive pulmonary disease) ([place])   . Diabetes mellitus without complication ([place])    denies  . GERD (gastroesophageal reflux disease)   . Headache   . Hyperlipidemia   . Hypertension   . Incontinence of urine   . Pre-diabetes   . Sarcoidosis    Past Surgical History:  Procedure Laterality Date  . ORIF WRIST FRACTURE Left 06/30/2017   Procedure: OPEN REDUCTION INTERNAL FIXATION (ORIF) WRIST FRACTURE;  Surgeon: Hiram Gash, MD;  Location: [facility];  Service: Orthopedics;  Laterality: Left;  . TUBAL LIGATION     Allergies  Allergen Reactions  . Codeine Nausea And Vomiting   No current facility-administered medications on file prior to encounter.   Current Outpatient Medications on  File Prior to Encounter  Medication Sig Dispense Refill  . Ascorbic Acid (VITAMIN C) 1000 MG tablet Take 2,000 mg by mouth daily.    . Cyanocobalamin (VITAMIN B 12 PO) Take 1 tablet by mouth daily.    . Magnesium Gluconate POWD Take 400 mg by mouth daily.    Marland Kitchen omeprazole (PRILOSEC) 20 MG capsule Take 1 capsule (20 mg total) by mouth daily for 14 days. 14 capsule 0   Social History   Socioeconomic History  . Marital status: Widowed    Spouse name: Not on file  . Number of children: 4  . Years of education: 47  . Highest education level: Not on file  Occupational History  . Occupation: Freight forwarder    Comment: Technical brewer  Tobacco Use  . Smoking status: Never Smoker  . Smokeless tobacco: Never Used  Substance and Sexual Activity  . Alcohol use: Yes    Comment: occ  . Drug use: No  . Sexual activity: Not Currently  Other Topics Concern  . Not on file  Social History Narrative   Lives at home with grandson Mckinley Jewel has ADHD and anxiety issues   His father Dellis Filbert is addicted and comes and goes   Is a widow   Social Determinants of Radio broadcast assistant Strain:   . Difficulty of Paying Living Expenses: Not on file  Food Insecurity:   . Worried About Charity fundraiser in the Last  Year: Not on file  . Ran Out of Food in the Last Year: Not on file  Transportation Needs:   . Lack of Transportation (Medical): Not on file  . Lack of Transportation (Non-Medical): Not on file  Physical Activity:   . Days of Exercise per Week: Not on file  . Minutes of Exercise per Session: Not on file  Stress:   . Feeling of Stress : Not on file  Social Connections:   . Frequency of Communication with Friends and Family: Not on file  . Frequency of Social Gatherings with Friends and Family: Not on file  . Attends Religious Services: Not on file  . Active Member of Clubs or Organizations: Not on file  . Attends Archivist Meetings: Not on file  . Marital Status:  Not on file  Intimate Partner Violence:   . Fear of Current or Ex-Partner: Not on file  . Emotionally Abused: Not on file  . Physically Abused: Not on file  . Sexually Abused: Not on file   Family History  Problem Relation Age of Onset  . COPD Mother   . Diabetes Mother   . Hepatitis C Mother   . Cancer Father        lung  . Alcohol abuse Father   . Drug abuse Son 32       overdose  . Drug abuse Son   . Hepatitis C Son   . Cancer Maternal Aunt     OBJECTIVE: Vitals:   02/23/19 1659  BP: (!) 187/101  Pulse: (!) 106  Resp: 19  Temp: 97.9 F (36.6 C)  SpO2: 97%    General appearance: alert; no distress Head: NCAT Lungs: clear to auscultation bilaterally Heart: regular rate and rhythm.  LT dorsalis pedis pulse 2+ and intact Extremities: no edema Skin: warm and dry; rash improved, NTTP, blanches with pressure, no obvious open sores or wounds, anterior shins appear shiny with minimal to no hair growth on distal bilateral LEs (see pictures below) Psychological: alert and cooperative; normal mood and affect        ASSESSMENT & PLAN:  1. Elevated blood pressure reading   2. Other skin changes     Meds ordered this encounter  Medications  . lisinopril (ZESTRIL) 20 MG tablet    Sig: Take 1 tablet (20 mg total) by mouth daily.    Dispense:  30 tablet    Refill:  2    Order Specific Question:   Supervising Provider    Answer:   Raylene Everts [id number]   Lower extremity rash improved.  Symptoms may be related to underlying vascular disease You may use compression stockings to help with circulation Blood pressure elevated in office We will start you on blood pressure medication today since you are unable to get in with PCP Please continue to monitor blood pressure at home and keep a log Eat a well balanced diet of fruits, vegetables and lean meats.  Avoid foods high in fat and salt Drink water.  At least half your body weight in ounces Exercise for at least  30 minutes daily Lisinopril sent in.  Take as directed Follow up with PCP for further evaluation and management of elevated blood pressure and lower extremity skin changes Return or go to the ED if you have any new or worsening symptoms such as fever, chills, nausea, vomiting, vision changes, fatigue, dizziness, chest pain, shortness of breath, swelling in your hands or feet, urinary symptoms, etc...  Reviewed  expectations re: course of current medical issues. Questions answered. Outlined signs and symptoms indicating need for more acute intervention. Patient verbalized understanding. After Visit Summary given.   Lestine Box, PA-C 02/23/19 [phone]

## 2019-03-21 ENCOUNTER — Ambulatory Visit: Payer: Self-pay | Attending: Internal Medicine

## 2019-03-21 ENCOUNTER — Other Ambulatory Visit: Payer: Self-pay

## 2019-03-21 DIAGNOSIS — Z20822 Contact with and (suspected) exposure to covid-19: Secondary | ICD-10-CM | POA: Insufficient documentation

## 2019-03-23 LAB — NOVEL CORONAVIRUS, NAA: SARS-CoV-2, NAA: NOT DETECTED

## 2019-03-27 ENCOUNTER — Telehealth: Payer: Self-pay | Admitting: *Deleted

## 2019-03-27 NOTE — Telephone Encounter (Signed)
Reviewed negative Covid19 results with the patient. No further questions. 

## 2019-06-06 ENCOUNTER — Ambulatory Visit: Payer: Self-pay | Admitting: Family Medicine

## 2020-03-18 DIAGNOSIS — X32XXXA Exposure to sunlight, initial encounter: Secondary | ICD-10-CM | POA: Diagnosis not present

## 2020-03-18 DIAGNOSIS — L308 Other specified dermatitis: Secondary | ICD-10-CM | POA: Diagnosis not present

## 2020-03-18 DIAGNOSIS — L821 Other seborrheic keratosis: Secondary | ICD-10-CM | POA: Diagnosis not present

## 2020-03-18 DIAGNOSIS — I872 Venous insufficiency (chronic) (peripheral): Secondary | ICD-10-CM | POA: Diagnosis not present

## 2020-03-18 DIAGNOSIS — L570 Actinic keratosis: Secondary | ICD-10-CM | POA: Diagnosis not present

## 2020-03-20 DIAGNOSIS — R7309 Other abnormal glucose: Secondary | ICD-10-CM | POA: Diagnosis not present

## 2020-03-20 DIAGNOSIS — I10 Essential (primary) hypertension: Secondary | ICD-10-CM | POA: Diagnosis not present

## 2020-03-20 DIAGNOSIS — Z1159 Encounter for screening for other viral diseases: Secondary | ICD-10-CM | POA: Diagnosis not present

## 2020-03-20 DIAGNOSIS — Z136 Encounter for screening for cardiovascular disorders: Secondary | ICD-10-CM | POA: Diagnosis not present

## 2020-03-20 DIAGNOSIS — L659 Nonscarring hair loss, unspecified: Secondary | ICD-10-CM | POA: Diagnosis not present

## 2020-03-20 DIAGNOSIS — E559 Vitamin D deficiency, unspecified: Secondary | ICD-10-CM | POA: Diagnosis not present

## 2020-03-20 DIAGNOSIS — R140 Abdominal distension (gaseous): Secondary | ICD-10-CM | POA: Diagnosis not present

## 2020-04-01 ENCOUNTER — Other Ambulatory Visit: Payer: Self-pay

## 2020-04-01 ENCOUNTER — Ambulatory Visit
Admission: RE | Admit: 2020-04-01 | Discharge: 2020-04-01 | Disposition: A | Discharge status: Home / Self Care | Payer: Medicare Other | Source: Ambulatory Visit | Attending: Family Medicine | Admitting: Family Medicine

## 2020-04-01 ENCOUNTER — Other Ambulatory Visit: Payer: Self-pay | Admitting: Family Medicine

## 2020-04-01 DIAGNOSIS — Z1231 Encounter for screening mammogram for malignant neoplasm of breast: Secondary | ICD-10-CM

## 2020-04-01 DIAGNOSIS — R140 Abdominal distension (gaseous): Secondary | ICD-10-CM | POA: Diagnosis not present

## 2020-04-01 DIAGNOSIS — Z1211 Encounter for screening for malignant neoplasm of colon: Secondary | ICD-10-CM | POA: Diagnosis not present

## 2020-05-10 DIAGNOSIS — R000 Tachycardia, unspecified: Secondary | ICD-10-CM | POA: Diagnosis not present

## 2020-05-10 DIAGNOSIS — R17 Unspecified jaundice: Secondary | ICD-10-CM | POA: Diagnosis not present

## 2020-05-10 DIAGNOSIS — I10 Essential (primary) hypertension: Secondary | ICD-10-CM | POA: Diagnosis not present

## 2020-06-19 DIAGNOSIS — R195 Other fecal abnormalities: Secondary | ICD-10-CM | POA: Diagnosis not present

## 2020-07-19 DIAGNOSIS — R6889 Other general symptoms and signs: Secondary | ICD-10-CM | POA: Diagnosis not present

## 2020-07-19 DIAGNOSIS — E010 Iodine-deficiency related diffuse (endemic) goiter: Secondary | ICD-10-CM | POA: Diagnosis not present

## 2020-07-19 DIAGNOSIS — I10 Essential (primary) hypertension: Secondary | ICD-10-CM | POA: Diagnosis not present

## 2020-07-19 DIAGNOSIS — R002 Palpitations: Secondary | ICD-10-CM | POA: Diagnosis not present

## 2020-07-19 DIAGNOSIS — L678 Other hair color and hair shaft abnormalities: Secondary | ICD-10-CM | POA: Diagnosis not present

## 2020-07-19 HISTORY — DX: Iodine-deficiency related diffuse (endemic) goiter: E01.0

## 2020-08-28 ENCOUNTER — Encounter: Payer: Self-pay | Admitting: Adult Health

## 2020-08-28 ENCOUNTER — Telehealth: Payer: Self-pay | Admitting: Adult Health

## 2020-08-28 ENCOUNTER — Ambulatory Visit: Payer: Medicare Other | Admitting: Adult Health

## 2020-08-28 ENCOUNTER — Other Ambulatory Visit: Payer: Self-pay

## 2020-08-28 ENCOUNTER — Other Ambulatory Visit (HOSPITAL_COMMUNITY)
Admission: RE | Admit: 2020-08-28 | Discharge: 2020-08-28 | Disposition: A | Discharge status: Home / Self Care | Payer: Medicare Other | Source: Ambulatory Visit | Attending: Adult Health | Admitting: Adult Health

## 2020-08-28 VITALS — BP 152/68 | HR 89 | Ht 62.5 in | Wt 144.0 lb

## 2020-08-28 DIAGNOSIS — Z124 Encounter for screening for malignant neoplasm of cervix: Secondary | ICD-10-CM | POA: Insufficient documentation

## 2020-08-28 DIAGNOSIS — R102 Pelvic and perineal pain: Secondary | ICD-10-CM

## 2020-08-28 DIAGNOSIS — L680 Hirsutism: Secondary | ICD-10-CM

## 2020-08-28 DIAGNOSIS — R7989 Other specified abnormal findings of blood chemistry: Secondary | ICD-10-CM | POA: Diagnosis not present

## 2020-08-28 NOTE — Progress Notes (Signed)
Patient ID: Emma Russell, female   DOB: 1950-11-13, 70 y.o.   MRN: [id number] History of Present Illness: Emma Russell is a 70 year old white female, widowed, PM in for elevated testosterone level, facial hair growth, dry skin, hair thinning on head, flushing and rapid heart rate at  times. She had  seen endocrinologist and  had labs thyroid was normal DHEA was normal and testosterone was 210.Marland Kitchen She says has been 23 years since last pap and pelvic.  PCP is Dr Lindell Noe  Current Medications, Allergies, Past Medical History, Past Surgical History, Family History and Social History were reviewed in [facility] record.     Review of Systems: See HPI for positives. Has some abdominal soreness She is not asexually active  Denies any vaginal bleeding   Physical Exam:BP (!) 152/68 (BP Location: Right Arm, Patient Position: Sitting, Cuff Size: Normal)   Pulse 89   Ht 5' 2.5" (1.588 m)   Wt 144 lb (65.3 kg)   BMI 25.92 kg/m   General:  Well developed, well nourished, no acute distress Skin:  Warm and dry Face she waxed today  Lungs; Clear to auscultation bilaterally Cardiovascular: Regular rate and rhythm Pelvic:  External genitalia is normal in appearance, no lesions.  The vagina is pale with loss of moisture and rugae Urethra has no lesions or masses. The cervix is smooth, pap with HR HPV genotyping performed. Uterus is felt to be normal size, shape, and contour.  No adnexal masses or tenderness noted.Bladder is non tender, no masses felt. Extremities/musculoskeletal:  No swelling or varicosities noted, no clubbing or cyanosis Psych:  No mood changes, alert and cooperative,seems happy AA is 0 Fall risk is low  Depression screen [facility] 2/9 08/28/2020 05/27/2016  Decreased Interest 0 0  Down, Depressed, Hopeless 0 0  PHQ - 2 Score 0 0  Altered sleeping 0 -  Tired, decreased energy 0 -  Change in appetite 0 -  Feeling bad or failure about yourself  0 -  Trouble  concentrating 0 -  Moving slowly or fidgety/restless 0 -  Suicidal thoughts 0 -  PHQ-9 Score 0 -    GAD 7 : Generalized Anxiety Score 08/28/2020  Nervous, Anxious, on Edge 1  Control/stop worrying 0  Worry too much - different things 0  Trouble relaxing 0  Restless 0  Easily annoyed or irritable 1  Afraid - awful might happen 0  Total GAD 7 Score 2      Upstream - 08/28/20 1148       Pregnancy Intention Screening   Does the patient want to become pregnant in the next year? No    Does the patient's partner want to become pregnant in the next year? No    Would the patient like to discuss contraceptive options today? No      Contraception Wrap Up   Current Method Female Sterilization    End Method Abstinence;Female Sterilization    Contraception Counseling Provided No             Examination chaperoned by [employer] RN   Impression and plan: 1. Elevated testosterone level in female Check free and total testosterone Discussed may not need to do anything, it is hormonal   2. Tenderness of female pelvic organs Will get pelvic US 6/27/222 a 12:30 pm at [facility] will talk when labs back  3. Hirsutism May try spirolactone   4. Routine Papanicolaou smear Pap sent

## 2020-08-28 NOTE — Telephone Encounter (Signed)
Left message I called her back.

## 2020-08-28 NOTE — Telephone Encounter (Signed)
Patient came into the office this afternoon stating that she would like to ask a quick question to Mrs. Anderson Malta, I let patient know that Anderson Malta will call her back before the day is done.

## 2020-08-29 NOTE — Telephone Encounter (Signed)
Left message I called 

## 2020-08-30 LAB — CYTOLOGY - PAP
Comment: NEGATIVE
Diagnosis: NEGATIVE
High risk HPV: NEGATIVE

## 2020-08-31 LAB — TESTOSTERONE,FREE AND TOTAL
Testosterone, Free: 4.8 pg/mL — ABNORMAL HIGH (ref 0.0–4.2)
Testosterone: 358 ng/dL — ABNORMAL HIGH (ref 3–67)

## 2020-09-02 ENCOUNTER — Telehealth: Payer: Self-pay | Admitting: Adult Health

## 2020-09-02 ENCOUNTER — Ambulatory Visit (HOSPITAL_COMMUNITY): Payer: Medicare Other

## 2020-09-02 ENCOUNTER — Other Ambulatory Visit: Payer: Self-pay | Admitting: Adult Health

## 2020-09-02 DIAGNOSIS — R7989 Other specified abnormal findings of blood chemistry: Secondary | ICD-10-CM

## 2020-09-02 DIAGNOSIS — R102 Pelvic and perineal pain: Secondary | ICD-10-CM

## 2020-09-02 NOTE — Telephone Encounter (Signed)
Pt aware that testosterone is rising, and will cancel [place] but will get CT of abdomen and pelvis to look at Ovaries and adrenal glands, discussed with Dr Elonda Husky

## 2020-09-02 NOTE — Telephone Encounter (Signed)
Pt aware that CT is 09/13/20 at 7 am at [facility]

## 2020-09-02 NOTE — Progress Notes (Signed)
Will get CT abd and pelvis at [facility] 09/13/20 at 7 am pt aware

## 2020-09-13 ENCOUNTER — Ambulatory Visit (HOSPITAL_COMMUNITY)
Admission: RE | Admit: 2020-09-13 | Discharge: 2020-09-13 | Disposition: A | Discharge status: Home / Self Care | Payer: Medicare Other | Source: Ambulatory Visit | Attending: Adult Health | Admitting: Adult Health

## 2020-09-13 ENCOUNTER — Other Ambulatory Visit: Payer: Self-pay

## 2020-09-13 DIAGNOSIS — R102 Pelvic and perineal pain: Secondary | ICD-10-CM | POA: Insufficient documentation

## 2020-09-13 DIAGNOSIS — I700 Atherosclerosis of aorta: Secondary | ICD-10-CM | POA: Diagnosis not present

## 2020-09-13 DIAGNOSIS — R7989 Other specified abnormal findings of blood chemistry: Secondary | ICD-10-CM

## 2020-09-13 DIAGNOSIS — K7689 Other specified diseases of liver: Secondary | ICD-10-CM | POA: Diagnosis not present

## 2020-09-15 ENCOUNTER — Telehealth: Payer: Self-pay | Admitting: Adult Health

## 2020-09-15 DIAGNOSIS — R102 Pelvic and perineal pain: Secondary | ICD-10-CM

## 2020-09-15 NOTE — Telephone Encounter (Signed)
called pt and discussed CT with her. 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

## 2020-09-16 ENCOUNTER — Telehealth: Payer: Self-pay | Admitting: Adult Health

## 2020-09-16 NOTE — Telephone Encounter (Signed)
[facility] aware that has appt with Dr Denman George 09/19/20 at 9 am be there a 8:30 am [facility]

## 2020-09-18 ENCOUNTER — Encounter: Payer: Self-pay | Admitting: Gynecologic Oncology

## 2020-09-19 ENCOUNTER — Encounter: Payer: Self-pay | Admitting: Gynecologic Oncology

## 2020-09-19 ENCOUNTER — Inpatient Hospital Stay: Payer: Medicare Other | Attending: Gynecologic Oncology | Admitting: Gynecologic Oncology

## 2020-09-19 ENCOUNTER — Other Ambulatory Visit: Payer: Self-pay

## 2020-09-19 ENCOUNTER — Encounter: Payer: Self-pay | Admitting: Oncology

## 2020-09-19 ENCOUNTER — Inpatient Hospital Stay: Payer: Medicare Other

## 2020-09-19 VITALS — BP 143/55 | HR 115 | Temp 98.7°F | Resp 20 | Ht 62.5 in | Wt 138.0 lb

## 2020-09-19 DIAGNOSIS — C7801 Secondary malignant neoplasm of right lung: Secondary | ICD-10-CM

## 2020-09-19 DIAGNOSIS — I10 Essential (primary) hypertension: Secondary | ICD-10-CM | POA: Diagnosis not present

## 2020-09-19 DIAGNOSIS — R197 Diarrhea, unspecified: Secondary | ICD-10-CM | POA: Insufficient documentation

## 2020-09-19 DIAGNOSIS — K219 Gastro-esophageal reflux disease without esophagitis: Secondary | ICD-10-CM | POA: Insufficient documentation

## 2020-09-19 DIAGNOSIS — L680 Hirsutism: Secondary | ICD-10-CM | POA: Insufficient documentation

## 2020-09-19 DIAGNOSIS — R160 Hepatomegaly, not elsewhere classified: Secondary | ICD-10-CM | POA: Diagnosis not present

## 2020-09-19 DIAGNOSIS — R232 Flushing: Secondary | ICD-10-CM | POA: Insufficient documentation

## 2020-09-19 DIAGNOSIS — L299 Pruritus, unspecified: Secondary | ICD-10-CM | POA: Diagnosis not present

## 2020-09-19 DIAGNOSIS — N838 Other noninflammatory disorders of ovary, fallopian tube and broad ligament: Secondary | ICD-10-CM

## 2020-09-19 DIAGNOSIS — E259 Adrenogenital disorder, unspecified: Secondary | ICD-10-CM | POA: Diagnosis not present

## 2020-09-19 DIAGNOSIS — R634 Abnormal weight loss: Secondary | ICD-10-CM | POA: Diagnosis not present

## 2020-09-19 DIAGNOSIS — J449 Chronic obstructive pulmonary disease, unspecified: Secondary | ICD-10-CM | POA: Diagnosis not present

## 2020-09-19 DIAGNOSIS — E785 Hyperlipidemia, unspecified: Secondary | ICD-10-CM | POA: Diagnosis not present

## 2020-09-19 DIAGNOSIS — C7802 Secondary malignant neoplasm of left lung: Secondary | ICD-10-CM

## 2020-09-19 DIAGNOSIS — C569 Malignant neoplasm of unspecified ovary: Secondary | ICD-10-CM | POA: Diagnosis present

## 2020-09-19 DIAGNOSIS — C786 Secondary malignant neoplasm of retroperitoneum and peritoneum: Secondary | ICD-10-CM

## 2020-09-19 DIAGNOSIS — D869 Sarcoidosis, unspecified: Secondary | ICD-10-CM | POA: Diagnosis not present

## 2020-09-19 DIAGNOSIS — Z801 Family history of malignant neoplasm of trachea, bronchus and lung: Secondary | ICD-10-CM | POA: Diagnosis not present

## 2020-09-19 DIAGNOSIS — R600 Localized edema: Secondary | ICD-10-CM | POA: Insufficient documentation

## 2020-09-19 DIAGNOSIS — K769 Liver disease, unspecified: Secondary | ICD-10-CM

## 2020-09-19 LAB — CBC WITH DIFFERENTIAL (CANCER CENTER ONLY)
Abs Immature Granulocytes: 0.01 10*3/uL (ref 0.00–0.07)
Basophils Absolute: 0.1 10*3/uL (ref 0.0–0.1)
Basophils Relative: 1 %
Eosinophils Absolute: 0.1 10*3/uL (ref 0.0–0.5)
Eosinophils Relative: 1 %
HCT: 43.6 % (ref 36.0–46.0)
Hemoglobin: 14.3 g/dL (ref 12.0–15.0)
Immature Granulocytes: 0 %
Lymphocytes Relative: 17 %
Lymphs Abs: 1.3 10*3/uL (ref 0.7–4.0)
MCH: 28.7 pg (ref 26.0–34.0)
MCHC: 32.8 g/dL (ref 30.0–36.0)
MCV: 87.4 fL (ref 80.0–100.0)
Monocytes Absolute: 0.4 10*3/uL (ref 0.1–1.0)
Monocytes Relative: 5 %
Neutro Abs: 5.7 10*3/uL (ref 1.7–7.7)
Neutrophils Relative %: 76 %
Platelet Count: 205 10*3/uL (ref 150–400)
RBC: 4.99 MIL/uL (ref 3.87–5.11)
RDW: 12.1 % (ref 11.5–15.5)
WBC Count: 7.5 10*3/uL (ref 4.0–10.5)
nRBC: 0 % (ref 0.0–0.2)

## 2020-09-19 LAB — COMPREHENSIVE METABOLIC PANEL
ALT: 14 U/L (ref 0–44)
AST: 19 U/L (ref 15–41)
Albumin: 4.3 g/dL (ref 3.5–5.0)
Alkaline Phosphatase: 56 U/L (ref 38–126)
Anion gap: 9 (ref 5–15)
BUN: 14 mg/dL (ref 8–23)
CO2: 28 mmol/L (ref 22–32)
Calcium: 9.7 mg/dL (ref 8.9–10.3)
Chloride: 104 mmol/L (ref 98–111)
Creatinine, Ser: 0.87 mg/dL (ref 0.44–1.00)
GFR, Estimated: >60 mL/min (ref >60)
Glucose, Bld: 98 mg/dL (ref 70–99)
Potassium: 4.6 mmol/L (ref 3.5–5.1)
Sodium: 141 mmol/L (ref 135–145)
Total Bilirubin: 1.2 mg/dL (ref 0.3–1.2)
Total Protein: 7.3 g/dL (ref 6.5–8.1)

## 2020-09-19 LAB — CEA (IN HOUSE-CHCC): CEA (CHCC-In House): <1 ng/mL (ref 0.00–5.00)

## 2020-09-19 NOTE — Patient Instructions (Addendum)
We will contact you with the results of your lab work and scans.  The hospital will contact you with an appointment for the CT biopsy and will also call you with an appointment to see the Medical Oncologist, Dr. Heath Lark.

## 2020-09-19 NOTE — Progress Notes (Signed)
Consult Note: Gyn-Onc  Consult was requested by Derrek Monaco NP for the evaluation of Emma Russell 70 y.o. female  CC:  Chief Complaint  Patient presents with   Ovarian mass, left   metastatic cancer   flushing   virilization    Assessment/Plan:  Emma Russell  is a 70 y.o.  year old with stage IV malignancy of unclear primary, in the setting of virilization, flushing and diarrhea and weight loss. She is a Sales promotion account executive Witness and does not accept blood transfusion under any circumstances.  She has very poor dentition.   Together we reviewed the CT images with the patient and her daughter. I demonstrated the apparent distribution of disease based on the imaging and why this represented stage IV disease and hematogenous spread. I explained that her symptoms were not consistent with a primary epithelial ovarian cancer, but potentially a sex cord stromal tumor (eg malignant sertoli leydig) or NET/carcinoid tumor.   I explained that with stage IV malignancies, surgery is not possible to resect all of the disease, and therefore some form of systemic therapy is necessary to control, or cure (if responsive) the disease. I explained that there may be a role for surgery at some point during her treatment, particularly if she demonstrates a good response to systemic therapy at other distant sites. Given that she has stage 4 disease requiring systemic therapy, is deconditioned (and likely with some degree of malnourishment) and a Jehovah's Witness declining all blood products, I do not recommend a radical primary debulking surgery for this patient as it carries with it greater potential harm than benefit.   We will optimize characterization of her malignancy with the following testing:  - MRI liver - CT chest - CT guided biopsy for pathology - tumor markers including inhibin B, AMH, AFP, hCG, urinary 5-HIAA, CA 125, CEA.   We will discuss her case at multidisciplinary conference, and  pending biopsy results and plan for consideration of the most appropriate systemic therapy.   With respect to her poor dentition, I encouraged her to schedule to see a dentist as soon as possible as she may have delays in systemic therapy if she requires resolution of oral infectious processes first.  HPI: Emma Russell is a 70 year old P4 who was seen in consultation at the request of Derrek Monaco, NP for evaluation of clinical stage IV ovarian malignancy in the setting of virilization, elevated free testosterone, flushing and diarrhea.  The patient reported that she began a concerted effort to lose weight in the summer of 2021 when she was noted to have very high blood pressure. However the weight loss became more extreme (30 lbs over 3 months) and she began experiencing symptoms of intermittent flushing, diarrhea, generalized itch, and virilization (female patterned hair loss, hirsutism on the face, abdomen and pubis, clitoromegaly).   She was seen by an endocrinologist who ordered a thyroid panel (which was normal) and a testosterone panel (free testosterone 4.8 (upper limit normal = 4.2), testosterone 358 (upper limit normal 358), and a DHEA-S (which was normal at 51). These studies were drawn on 07/19/20 and 08/28/20.   She was referred to a gynecologist for presumed ovarian source of hyperandrogenism. A pelvic examination by Derrek Monaco, NP was unremarkable, however, given the laboratory findings, a CT abd/pelvis was ordered and performed on 09/13/20 to evaluate for possible testosterone secreting tumor.  The CT scan showed innumerable small pulmonary nodules throughout bilateral lung bases the largest measuring 6 mm.  There were numerous heterogeneous ill-defined hypodense masses in the liver parenchyma suggestive of metastatic disease.  The pancreas was normal.  There was fatty adenomatous thickening of the bilateral adrenal glands but without discrete mass seen.  The appendix appeared  normal.  However there was a spiculated mass in the right lower quadrant of the small bowel mesentery measuring 2.6 x 2.4 cm.  There was a heterogeneous mixed solid and cystic mass of the left ovary measuring 6.7 x 5.8 cm.  Incidental findings of uterine fibroids was also made.  There was a small volume ascites in the pelvis.  There were multiple peritoneal and omental nodules throughout the abdomen with the largest measuring 1 cm.  There is no apparent lymphadenopathy.  These findings are suggestive for a stage IV malignancy and she was referred for consultation with gynecologic oncology.  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  The patient takes a significant amount of self prescribed natural supplements such as turmeric, milk thistle, horse Chestnut and others.  She has self described very poor dentition and needs to have her teeth pulled due to infections in her mouth and lack of dental care over the years.  Her surgical history is most significant for a tubal ligation, and resection of of lipoma from her upper back.  Her gynecologic history is remarkable for 4 prior vaginal deliveries and a tubal ligation.  Her family cancer history is significant for a father with liver and lung cancer, and a maternal grandmother with what she describes as a female cancer of some sort.  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 She lives with her grandson who is 16 years of age.  She is a non-smoker and is a Sales promotion account executive Witness declining or blood product transfusions, even in life threatening situations  Current Meds:  Outpatient Encounter Medications as of 09/19/2020  Medication Sig   Acetylcysteine (NAC PO) Take by mouth.   Ascorbic Acid (VITAMIN C) 1000 MG tablet Take 2,000 mg by mouth daily.   B  Complex Vitamins (VITAMIN B COMPLEX) TABS See admin instructions.   cholecalciferol (VITAMIN D) 25 MCG (1000 UNIT) tablet 2-3 tablets   Coenzyme Q10 (CO Q 10) 100 MG CAPS 1 capsule   Cyanocobalamin (VITAMIN B 12 PO) Take 1 tablet by mouth daily.   HORSE CHESTNUT PO Take by mouth.   Magnesium Gluconate POWD Take 400 mg by mouth daily.   Milk Thistle 150 MG CAPS Take 1 capsule by mouth in the morning, at noon, and at bedtime.   Turmeric (QC TUMERIC COMPLEX PO) Take 1,350 mg by mouth daily as needed.   vitamin k 100 MCG tablet 1 tablet   No facility-administered encounter medications on file as of 09/19/2020.    Allergy:  Allergies  Allergen Reactions   Codeine Nausea And Vomiting    Social Hx:   Social History   Socioeconomic History   Marital status: Widowed    Spouse name: Not on file   Number of children: 4   Years of education: 12   Highest education level: Not on file  Occupational History   Occupation: Freight forwarder    Comment: courtyard storage  Tobacco Use   Smoking status: Never    Passive exposure: Past (16years)   Smokeless tobacco: Never  Vaping Use   Vaping Use: Never used  Substance and Sexual Activity   Alcohol use: Not Currently    Comment: occ   Drug use: No   Sexual activity: Not Currently    Birth control/protection: Surgical    Comment: tubal  Other Topics Concern   Not on file  Social History Narrative   Lives at home with grandson Mckinley Jewel has ADHD and anxiety issues   His father Dellis Filbert is addicted and comes and goes   Is a widow   Social Determinants of Sales executive: Low Risk    Difficulty of Paying Living Expenses: Not very hard  Food Insecurity: No Food Insecurity   Worried About Charity fundraiser in the Last Year: Never true   Arboriculturist in the Last Year: Never true  Transportation Needs: No Transportation Needs   Lack of Transportation (Medical): No   Lack of Transportation (Non-Medical): No   Physical Activity: Sufficiently Active   Days of Exercise per Week: 7 days   Minutes of Exercise per Session: 50 min  Stress: No Stress Concern Present   Feeling of Stress : Not at all  Social Connections: Moderately Isolated   Frequency of Communication with Friends and Family: Twice a week   Frequency of Social Gatherings with Friends and Family: Three times a week   Attends Religious Services: More than 4 times per year   Active Member of Clubs or Organizations: No   Attends Archivist Meetings: Patient refused   Marital Status: Widowed  Intimate Partner Violence: Not At Risk   Fear of Current or Ex-Partner: No   Emotionally Abused: No   Physically Abused: No   Sexually Abused: No    Past Surgical Hx:  Past Surgical History:  Procedure Laterality Date   ORIF WRIST FRACTURE Left 06/30/2017   Procedure: OPEN REDUCTION INTERNAL FIXATION (ORIF) WRIST FRACTURE;  Surgeon: Hiram Gash, MD;  Location: [facility];  Service: Orthopedics;  Laterality: Left;   TUBAL LIGATION      Past Medical Hx:  Past Medical History:  Diagnosis Date   Anxiety    Cataract    COPD (chronic obstructive pulmonary disease) (HCC)    GERD (gastroesophageal reflux disease)    Headache    Hyperlipidemia    Hypertension    Incontinence of urine    Pre-diabetes    Sarcoidosis     Past Gynecological History:  SVD x 4 No LMP recorded. Patient is postmenopausal.  Family Hx:  Family History  Problem Relation Age of Onset   COPD Mother    Diabetes Mother    Hepatitis C Mother    Alcohol abuse Father    Lung cancer Father    Cancer Maternal Aunt    Uterine cancer Maternal Grandmother    Drug abuse Son 43       overdose   Drug abuse Son    Hepatitis C Son    Breast cancer Neg Hx    Colon cancer Neg Hx    Pancreatic cancer Neg Hx    Prostate cancer Neg Hx    Ovarian cancer Neg Hx     Review of Systems:  Constitutional  + flushing, ENT + poor dentition Skin/Breast  + intermittent  itch/rash generalized, + hair loss, + dry skin, + hirsuitism Cardiovascular  No chest pain, + mild peripheral edema  Pulmonary  + chronic cough Gastro Intestinal  + diarrhoea, intermittent x 6 months. No bright red blood per rectum, no abdominal pain,  or constipation.  Genito Urinary  No frequency, urgency, dysuria, no bleeding, + clitoromegaly  Musculo Skeletal  No myalgia, arthralgia, joint swelling or pain  Neurologic  No weakness, numbness, change in gait,  Psychology  No depression, anxiety, insomnia.   Vitals:  Blood pressure (!) 143/55, pulse (!) 115, temperature 98.7 F (37.1 C), temperature source Oral, resp. rate 20, height 5' 2.5" (1.588 m), weight 138 lb (62.6 kg), SpO2 99 %.  Physical Exam: Head: Poor dentition noted Neck  Supple NROM, without any enlargements.  Lymph Node Survey No cervical supraclavicular or inguinal adenopathy Cardiovascular  Warm peripheries, regular heart rate, mottled appearance of feet  Lungs  Resonant to percussion, no increased WOB at rest on the bed Skin  No rash/lesions/breakdown, multiple petechiae, + female patterned baldness, + hirsuitism on face, thighs, abdomen.  Psychiatry  Alert and oriented to person, place, and time  Abdomen  Normoactive bowel sounds, abdomen soft, non-tender and nonobese without evidence of hernia. Appreciate some firmness in right upper quadrant (possibly consistent with omental disease) but no discrete hepatomegaly or abdominal mass.  Back No CVA tenderness Genito Urinary  Vulva/vagina: Clitoromegaly.  No lesions. No discharge or bleeding.  Bladder/urethra:  No lesions or masses, well supported bladder  Vagina: rectocele present, no lesions  Cervix: Normal appearing, no lesions.  Uterus:  Slightly bulky, mobile, no parametrial involvement or nodularity.  Adnexa: unable to appreciate discrete masses. Rectal  Good tone, no masses no cul de sac nodularity.  Extremities  Mild pedal edema bilaterally,  slightly red/mottled feet in color.   90 minutes of total time was spent for this extremely complex patient encounter, including preparation, face-to-face counseling with the patient and coordination of care, discussion with collaborating consultants, review of imaging (results and images), communication with the referring provider and documentation of the encounter.   Thereasa Solo, MD  09/19/2020, 10:11 AM

## 2020-09-19 NOTE — Progress Notes (Signed)
Met with Emma Russell and her daughter and reviewed plan of care for a CT biopsy, CT chest, MRI liver and referral to medical oncology - Dr. Alvy Bimler. Went over upcoming appointments and provided them with a printed schedule.  Encouraged them to call with any questions or concerns.

## 2020-09-20 ENCOUNTER — Telehealth: Payer: Self-pay

## 2020-09-20 ENCOUNTER — Telehealth: Payer: Self-pay | Admitting: Oncology

## 2020-09-20 LAB — BETA HCG QUANT (REF LAB): hCG Quant: <1 m[IU]/mL

## 2020-09-20 LAB — CA 125: Cancer Antigen (CA) 125: 35.4 U/mL (ref 0.0–38.1)

## 2020-09-20 LAB — AFP TUMOR MARKER: AFP, Serum, Tumor Marker: 1.9 ng/mL (ref 0.0–9.2)

## 2020-09-20 LAB — INHIBIN B: Inhibin B: 7.6 pg/mL (ref 0.0–16.9)

## 2020-09-20 NOTE — Telephone Encounter (Signed)
LM that the quantitative HCG; CA-125;CEA,AFP,WBC, and C-met are all WNL. There are2 Markers pending and can take ~7 days to result. Will let you know these results when available.

## 2020-09-20 NOTE — Telephone Encounter (Signed)
Maudie Mercury (daughter) called and asked about the CT Biopsy.  Advised her it may still be in review and that we will check on it and call her back Monday morning.  She verbalized agreement.

## 2020-09-23 ENCOUNTER — Telehealth: Payer: Self-pay | Admitting: Oncology

## 2020-09-23 LAB — ANTI MULLERIAN HORMONE: ANTI-MULLERIAN HORMONE (AMH): <0.015 ng/mL

## 2020-09-23 NOTE — Telephone Encounter (Signed)
Called Emma Russell and advised her that her CT Biopsy was reviewed and that the radiologist would like to see the results from her MRI liver on Friday before scheduling it.  Discussed that we will cancel Emma Russell apt on 09/30/20 for now until we know when the CT biopsy will be. As soon as it is scheduled, we will reschedule her appointment with Emma Russell. She verbalized understanding and agreement.

## 2020-09-24 ENCOUNTER — Telehealth: Payer: Self-pay | Admitting: Oncology

## 2020-09-24 ENCOUNTER — Other Ambulatory Visit: Payer: Self-pay

## 2020-09-24 DIAGNOSIS — L299 Pruritus, unspecified: Secondary | ICD-10-CM | POA: Diagnosis not present

## 2020-09-24 DIAGNOSIS — R160 Hepatomegaly, not elsewhere classified: Secondary | ICD-10-CM

## 2020-09-24 DIAGNOSIS — K219 Gastro-esophageal reflux disease without esophagitis: Secondary | ICD-10-CM | POA: Diagnosis not present

## 2020-09-24 DIAGNOSIS — C7801 Secondary malignant neoplasm of right lung: Secondary | ICD-10-CM

## 2020-09-24 DIAGNOSIS — E785 Hyperlipidemia, unspecified: Secondary | ICD-10-CM | POA: Diagnosis not present

## 2020-09-24 DIAGNOSIS — J449 Chronic obstructive pulmonary disease, unspecified: Secondary | ICD-10-CM | POA: Diagnosis not present

## 2020-09-24 DIAGNOSIS — E259 Adrenogenital disorder, unspecified: Secondary | ICD-10-CM | POA: Diagnosis not present

## 2020-09-24 DIAGNOSIS — R600 Localized edema: Secondary | ICD-10-CM | POA: Diagnosis not present

## 2020-09-24 DIAGNOSIS — C569 Malignant neoplasm of unspecified ovary: Secondary | ICD-10-CM | POA: Diagnosis not present

## 2020-09-24 DIAGNOSIS — C7802 Secondary malignant neoplasm of left lung: Secondary | ICD-10-CM | POA: Diagnosis not present

## 2020-09-24 DIAGNOSIS — L680 Hirsutism: Secondary | ICD-10-CM | POA: Diagnosis not present

## 2020-09-24 DIAGNOSIS — R197 Diarrhea, unspecified: Secondary | ICD-10-CM | POA: Diagnosis not present

## 2020-09-24 DIAGNOSIS — R232 Flushing: Secondary | ICD-10-CM | POA: Diagnosis not present

## 2020-09-24 DIAGNOSIS — C786 Secondary malignant neoplasm of retroperitoneum and peritoneum: Secondary | ICD-10-CM

## 2020-09-24 DIAGNOSIS — Z801 Family history of malignant neoplasm of trachea, bronchus and lung: Secondary | ICD-10-CM | POA: Diagnosis not present

## 2020-09-24 DIAGNOSIS — I10 Essential (primary) hypertension: Secondary | ICD-10-CM | POA: Diagnosis not present

## 2020-09-24 DIAGNOSIS — N838 Other noninflammatory disorders of ovary, fallopian tube and broad ligament: Secondary | ICD-10-CM

## 2020-09-24 DIAGNOSIS — D869 Sarcoidosis, unspecified: Secondary | ICD-10-CM | POA: Diagnosis not present

## 2020-09-24 DIAGNOSIS — R634 Abnormal weight loss: Secondary | ICD-10-CM | POA: Diagnosis not present

## 2020-09-24 NOTE — Telephone Encounter (Signed)
Emma Russell (daughter) called and said Emma Russell wants to cancel the scans on Friday and wants to hold off on the CT biopsy.  [name] doesn't want to rush in to things and is looking in to natural remedies.  She is "dead set" against having chemo and is worried about the dental work she will need before getting chemo.    Discussed that we can refer [name] to palliative care/hospice if she decides against treatment and they are interested in a referral for palliative care.

## 2020-09-26 ENCOUNTER — Telehealth: Payer: Self-pay | Admitting: Gynecologic Oncology

## 2020-09-26 NOTE — Telephone Encounter (Signed)
Left message with patient to contact our office regarding scheduling a biopsy for tissue diagnosis. I informed the patient that we would not be able to set up any care for her (including hospice) or counsel her regarding recommended therapy, without a tissue diagnosis. She does not have a diagnosis of ovarian cancer at present.  Her tumor marker profile is not consistent with ovarian cancer.  Thereasa Solo, MD

## 2020-09-27 ENCOUNTER — Ambulatory Visit (HOSPITAL_COMMUNITY): Payer: Medicare Other

## 2020-09-30 ENCOUNTER — Ambulatory Visit: Payer: Medicare Other | Admitting: Hematology and Oncology

## 2020-09-30 ENCOUNTER — Other Ambulatory Visit: Payer: Self-pay | Admitting: Oncology

## 2020-09-30 ENCOUNTER — Other Ambulatory Visit: Payer: Medicare Other

## 2020-09-30 NOTE — Progress Notes (Signed)
Gynecologic Oncology Multi-Disciplinary Disposition Conference Note  Date of the Conference: 09/30/2020  Patient Name: Emma Russell  Referring Provider: Derrek Monaco, NP Primary GYN Oncologist: Dr. Denman George  Stage/Disposition:  Stage IV - potential sex cord stromal tumor (eg. Malignant sertoli leydig) or NET/carcinoid tumor. Disposition is to CT biopsy followed by discussion on systemic therapy and side effects.  If biopsy is not done, palliative/hospice referral would need to be initiated from patient's primary care doctor.   This Multidisciplinary conference took place involving physicians from [place], [place], Radiation Oncology, Pathology, Radiology along with the Gynecologic Oncology Nurse Practitioner and RN.  Comprehensive assessment of the patient's malignancy, staging, need for surgery, chemotherapy, radiation therapy, and need for further testing were reviewed. Supportive measures, both inpatient and following discharge were also discussed. The recommended plan of care is documented. Greater than 35 minutes were spent correlating and coordinating this patient's care.

## 2020-10-01 ENCOUNTER — Telehealth: Payer: Self-pay | Admitting: Oncology

## 2020-10-01 ENCOUNTER — Ambulatory Visit (HOSPITAL_COMMUNITY): Payer: Medicare Other

## 2020-10-01 NOTE — Telephone Encounter (Signed)
Called Emma Russell and reviewed recommendations from Dr. Denman George for imaging and a CT biopsy to determine a cancer diagnosis. Discussed that the imaging suggests cancer but we need to have a tissue biopsy to confirm the diagnosis and to determine the correct treatment.  She said she has decided not to have further testing since she does not want chemotherapy.  She knows that she can call back if she changes her mind.  Also advised her that Dr. Denman George is not able to provide a palliative/hospice consult since we do not have a cancer diagnosis.  She would need to contact her primary care doctor for this.  She verbalized understanding and agreement.

## 2020-10-08 LAB — 5 HIAA, QUANTITATIVE, URINE, 24 HOUR
5-HIAA, Ur: 165.7 mg/L
5-HIAA,Quant.,24 Hr Urine: 256.8 mg/24 hr — ABNORMAL HIGH (ref 0.0–14.9)
Total Volume: 1550

## 2020-10-10 ENCOUNTER — Other Ambulatory Visit (HOSPITAL_COMMUNITY): Payer: Medicare Other

## 2020-10-25 DIAGNOSIS — R634 Abnormal weight loss: Secondary | ICD-10-CM | POA: Diagnosis not present

## 2020-10-25 DIAGNOSIS — E559 Vitamin D deficiency, unspecified: Secondary | ICD-10-CM | POA: Diagnosis not present

## 2020-10-25 DIAGNOSIS — C7802 Secondary malignant neoplasm of left lung: Secondary | ICD-10-CM | POA: Diagnosis not present

## 2020-10-25 DIAGNOSIS — Z0000 Encounter for general adult medical examination without abnormal findings: Secondary | ICD-10-CM | POA: Diagnosis not present

## 2020-10-25 DIAGNOSIS — D3A00 Benign carcinoid tumor of unspecified site: Secondary | ICD-10-CM | POA: Diagnosis not present

## 2020-10-25 DIAGNOSIS — C7B00 Secondary carcinoid tumors, unspecified site: Secondary | ICD-10-CM | POA: Diagnosis not present

## 2020-10-25 DIAGNOSIS — E349 Endocrine disorder, unspecified: Secondary | ICD-10-CM | POA: Diagnosis not present

## 2020-10-25 DIAGNOSIS — R7989 Other specified abnormal findings of blood chemistry: Secondary | ICD-10-CM | POA: Diagnosis not present

## 2020-10-25 DIAGNOSIS — C7801 Secondary malignant neoplasm of right lung: Secondary | ICD-10-CM | POA: Diagnosis not present

## 2020-10-30 ENCOUNTER — Ambulatory Visit (INDEPENDENT_AMBULATORY_CARE_PROVIDER_SITE_OTHER): Payer: Medicare Other | Admitting: Internal Medicine

## 2020-11-07 ENCOUNTER — Other Ambulatory Visit: Payer: Self-pay

## 2020-11-07 ENCOUNTER — Other Ambulatory Visit: Payer: Medicare Other

## 2020-11-07 ENCOUNTER — Other Ambulatory Visit: Payer: Medicare Other | Admitting: *Deleted

## 2020-11-07 VITALS — BP 154/72 | HR 93 | Temp 97.9°F | Resp 18

## 2020-11-07 DIAGNOSIS — Z515 Encounter for palliative care: Secondary | ICD-10-CM

## 2020-11-07 NOTE — Progress Notes (Signed)
COMM[facility]ITY PALLIATIVE CARE SW NOTE  PATIENT NAME: Emma Russell DOB: 1950-10-30 MRN: [id number]  PRIMARY CARE PROVIDER: Glenis Smoker, MD  RESPONSIBLE PARTY:  Acct ID - Guarantor Home Phone Work Phone Relationship Acct Type  [id number] KHADAJAH, [phone]  Self P/F     [address], [place], [place] [address]     PLAN OF CARE and INTERVENTIONS:             GOALS OF CARE/ ADVANCE CARE PLANNING:  Goal is for patient to remain at home. Patient is a full code.  SOCIAL/EMOTIONAL/SPIRITUAL ASSESSMENT/ INTERVENTIONS:  SW and RN-M .Nadara Mustard completed a joint visit with patient at her home. Education was provided about the palliative care program, eligibility, visit frequency and contact information. Patient provided verbal and written consent. Patient reported that she was doing well overall. She is independent for all ADL's. She drives herself to appointments and to run errands. She is eating better and her appetite is good.Patient does have episodes where she becomes flushed, rapid heart rate and decreased blood pressure. Patient was diagnosed with Cancer after these aforementioned episodes kept reoccurring and she lost 35 lbs. She declined chemotherapy, opting to eating a healthier diet and practicing alternative medicines. Patient describes that she does have an intermittent feeling of "soreness"  in her stomach. She does have some swelling in her ankles. Patient completes a brisk walk for 50 minutes daily. Patient continues to "spells" when she has anxiety, but they have decreased significantly. Patient is a full code, and is in the process of completing a healthcare power of attorney. SW provided education regarding the DNR and MOST form and left copies in her home for review. Patient is Jehovah Witness and desires no blood transfusions. No other concerns noted. Patient is open to ongoing support/visits from palliative care team. Patient advised of team change and she verbalized  understanding.  PATIENT/CAREGIVER EDUCATION/ COPING:  Patient appears to be coping well. PERSONAL EMERGENCY PLAN:  911 can be activated for emergencies.  COMMUNITY RESOURCES COORDINATION/ HEALTH CARE NAVIGATION:  None FINANCIAL/LEGAL CONCERNS/INTERVENTIONS:  None     SOCIAL HX:  Social History   Tobacco Use   Smoking status: Never    Passive exposure: Past (16years)   Smokeless tobacco: Never  Substance Use Topics   Alcohol use: Not Currently    Comment: occ    CODE STATUS: Full Code ADVANCED DIRECTIVES: In process MOST FORM COMPLETE:  Education provided HOSPICE EDUCATION PROVIDED: No  PPS: Patient is independent for all ADL's.  Duration of visit and documentation: 60 minutes  Katheren Puller, LCSW

## 2020-11-18 NOTE — Progress Notes (Signed)
[facility] PALLIATIVE CARE RN NOTE  PATIENT NAME: Emma Russell DOB: 1951/03/01 MRN: [id number]  PRIMARY CARE PROVIDER: Glenis Smoker, MD  RESPONSIBLE PARTY:  Acct ID - Guarantor Home Phone Work Phone Relationship Acct Type  [id number] Emma, [phone]  Self P/F     [address], [place], [place] [address]   Covid-19 Pre-screening Negative  PLAN OF CARE and INTERVENTION:  ADVANCE CARE PLANNING/GOALS OF CARE: Goal is for patient to remain as active and independent as possible. She is a Full code. PATIENT/CAREGIVER EDUCATION: Explained palliative care services, symptom management DISEASE STATUS: Joint visit made with LCSW, M. Lonon. Met with patient in her home. She is alert and oriented x 4. Pleasant mood and engaging. She does experience some abdominal pain at times and states that taking Vitamin C helps. She was able to provide her health history. She says she started having spells a few months ago where her her heart rate elevates above 100, her blood pressure drops low and she comes diaphoretic and anxious. She went to be evaluated in the office and it was found that her testosterone was elevated to 258. She was referred to an OBGYN, and there it had increased to 300. It is now back down in the 200s. She had a CT scan performed which showed spots on her ovaries, lungs, liver, colon and stomach per patient. She was diagnosed with gastric carcinoma. She lost 35 lbs rapidly within a few months. She does not want to take any chemotherapy treatments, as she looks at this as poison in her body. She is not a surgical candidate. She is trying natural alternative medications and vitamins. She is a Sales promotion account executive witness so does not take blood transfusions. She is walking about 50 minutes per day. She denies shortness of breath despite her history of COPD. Her appetite is good. No difficulty swallowing. She is continent of both bowel and bladder. She remains independent with her ADLs  and is still able to drive. She has chronic swelling in her left leg, but says it is stable. She says her "spells " are less severe and does not occur as often. She mainly only gets anxious when getting her blood pressure checked and going to the doctor. She is sleeping well throughout the night. Goals of care reviewed. MOST forms left in the home as patient wants to review these with her doctor and pastor along with discussing a DNR. She is agreeable to future visits with palliative care. Consent signed.   HISTORY OF PRESENT ILLNESS:  This is a 70 yo female with a diagnosis of gastric carcinoma w/mets. She has a history of COPD, hypertension, hirsutism, sarcoidosis, prediabetes and hyperlipidemia. Palliative care team has been asked to follow patient to assist with symptom management, goals of care and complex decision making.  CODE STATUS: Full code ADVANCED DIRECTIVES: Y MOST FORM: no PPS: 70%   PHYSICAL EXAM:   VITALS: Today's Vitals   11/07/20 1231  BP: (!) 154/72  Pulse: 93  Resp: 18  Temp: 97.9 F (36.6 C)  TempSrc: Temporal  SpO2: 99%  PainSc: 2   PainLoc: Abdomen    LUNGS: clear to auscultation  CARDIAC: Cor RRR EXTREMITIES: Chronic, stable edema in left lower extremity SKIN: Skin color, texture, turgor normal. No rashes or lesions  NEURO:  Alert and oriented x 4, pleasant mood and engaging, ambulatory   (Duration of visit and documentation 60 minutes)   Daryl Eastern, RN BSN

## 2020-11-25 ENCOUNTER — Ambulatory Visit (INDEPENDENT_AMBULATORY_CARE_PROVIDER_SITE_OTHER): Payer: Medicare Other | Admitting: Internal Medicine

## 2020-11-25 ENCOUNTER — Encounter: Payer: Self-pay | Admitting: Internal Medicine

## 2020-11-25 ENCOUNTER — Other Ambulatory Visit: Payer: Self-pay

## 2020-11-25 VITALS — BP 154/66 | HR 87 | Resp 18 | Ht 62.0 in | Wt 137.0 lb

## 2020-11-25 DIAGNOSIS — Z7689 Persons encountering health services in other specified circumstances: Secondary | ICD-10-CM | POA: Diagnosis not present

## 2020-11-25 DIAGNOSIS — E559 Vitamin D deficiency, unspecified: Secondary | ICD-10-CM | POA: Diagnosis not present

## 2020-11-25 DIAGNOSIS — Z2821 Immunization not carried out because of patient refusal: Secondary | ICD-10-CM

## 2020-11-25 DIAGNOSIS — Z789 Other specified health status: Secondary | ICD-10-CM

## 2020-11-25 DIAGNOSIS — N838 Other noninflammatory disorders of ovary, fallopian tube and broad ligament: Secondary | ICD-10-CM | POA: Diagnosis not present

## 2020-11-25 DIAGNOSIS — Z531 Procedure and treatment not carried out because of patient's decision for reasons of belief and group pressure: Secondary | ICD-10-CM | POA: Insufficient documentation

## 2020-11-25 DIAGNOSIS — I10 Essential (primary) hypertension: Secondary | ICD-10-CM | POA: Diagnosis not present

## 2020-11-25 DIAGNOSIS — IMO0001 Reserved for inherently not codable concepts without codable children: Secondary | ICD-10-CM

## 2020-11-25 DIAGNOSIS — D869 Sarcoidosis, unspecified: Secondary | ICD-10-CM | POA: Diagnosis not present

## 2020-11-25 NOTE — Assessment & Plan Note (Addendum)
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

## 2020-11-25 NOTE — Progress Notes (Signed)
New Patient Office Visit  Subjective:  Patient ID: Emma Russell, female    DOB: November 29, 1950  Age: 70 y.o. MRN: [id number]  CC:  Chief Complaint  Patient presents with   New Patient (Initial Visit)    New patient was seeing dr Lindell Noe in [place].    HPI AFIA MESSENGER is a 70 year old female with PMH of ovarian mass with metastasis, carcinoid syndrome, sarcoidosis, HLD and HTN who presents for establishing care.  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  CT scan of the abdomen and pelvis showed - Numerous heterogeneous, ill-defined hypodense masses of the liver. There is a spiculated mass in the right lower quadrant small bowel mesentery measuring 2.6 x 2.4 cm. Multiple peritoneal and omental nodules throughout the abdomen. Innumerable small pulmonary nodules throughout the included lung bases, largest in the left lower lobe measuring 6 mm.  Her free testosterone level was elevated.  Her urine 5-HIAA was also elevated.  Her CEA and AFP were WNL.  She visited GYN oncology, who recommended chemotherapy, but she refused.  She has had palliative care nurse visit already as well.  She complains of intermittent flushing sensation, palpitations and ear fullness at nighttime.  She denies any ear pain, discharge or dizziness currently.  She asked about nutritional supplements.  Her BP was elevated in the office today.  She states that she has whitecoat hypertension.  She also states that she has been eating high salt diet, which she needs to be improve.  She denies any chest pain, dyspnea or palpitations.  She denies any vaccination.  She is a Restaurant manager, fast food.  Past Medical History:  Diagnosis Date   Anxiety    Cataract    COPD (chronic obstructive pulmonary disease) (HCC)    GERD (gastroesophageal reflux disease)    Headache    Hyperlipidemia    Hypertension    Incontinence of urine    Pre-diabetes     Sarcoidosis    Thyromegaly 07/19/2020    Past Surgical History:  Procedure Laterality Date   ORIF WRIST FRACTURE Left 06/30/2017   Procedure: OPEN REDUCTION INTERNAL FIXATION (ORIF) WRIST FRACTURE;  Surgeon: Hiram Gash, MD;  Location: [facility];  Service: Orthopedics;  Laterality: Left;   TUBAL LIGATION      Family History  Problem Relation Age of Onset   COPD Mother    Diabetes Mother    Hepatitis C Mother    Alcohol abuse Father    Lung cancer Father    Cancer Maternal Aunt    Uterine cancer Maternal Grandmother    Drug abuse Son 27       overdose   Drug abuse Son    Hepatitis C Son    Breast cancer Neg Hx    Colon cancer Neg Hx    Pancreatic cancer Neg Hx    Prostate cancer Neg Hx    Ovarian cancer Neg Hx     Social History   Socioeconomic History   Marital status: Widowed    Spouse name: Not on file   Number of children: 4   Years of education: 12   Highest education level: Not on file  Occupational History   Occupation: Freight forwarder    Comment: courtyard storage  Tobacco Use   Smoking status: Never    Passive exposure: Past (16years)   Smokeless tobacco: Never  Vaping Use   Vaping Use: Never used  Substance and  Sexual Activity   Alcohol use: Not Currently    Comment: occ   Drug use: No   Sexual activity: Not Currently    Birth control/protection: Surgical    Comment: tubal  Other Topics Concern   Not on file  Social History Narrative   Lives at home with grandson Mckinley Jewel has ADHD and anxiety issues   His father Dellis Filbert is addicted and comes and goes   Is a widow   Social Determinants of Sales executive: Low Risk    Difficulty of Paying Living Expenses: Not very hard  Food Insecurity: No Food Insecurity   Worried About Charity fundraiser in the Last Year: Never true   Arboriculturist in the Last Year: Never true  Transportation Needs: No Transportation Needs   Lack of Transportation (Medical): No   Lack of  Transportation (Non-Medical): No  Physical Activity: Sufficiently Active   Days of Exercise per Week: 7 days   Minutes of Exercise per Session: 50 min  Stress: No Stress Concern Present   Feeling of Stress : Not at all  Social Connections: Moderately Isolated   Frequency of Communication with Friends and Family: Twice a week   Frequency of Social Gatherings with Friends and Family: Three times a week   Attends Religious Services: More than 4 times per year   Active Member of Clubs or Organizations: No   Attends Archivist Meetings: Patient refused   Marital Status: Widowed  Human resources officer Violence: Not At Risk   Fear of Current or Ex-Partner: No   Emotionally Abused: No   Physically Abused: No   Sexually Abused: No    ROS Review of Systems  Constitutional:  Negative for chills and fever.  HENT:  Negative for congestion, sinus pressure and sinus pain.   Eyes:  Negative for pain and discharge.  Respiratory:  Negative for cough and shortness of breath.   Cardiovascular:  Negative for chest pain and palpitations.  Gastrointestinal:  Negative for constipation, nausea and vomiting.  Genitourinary:  Negative for dysuria and hematuria.  Musculoskeletal:  Negative for neck pain and neck stiffness.  Skin:  Negative for rash.  Neurological:  Negative for dizziness and weakness.  Psychiatric/Behavioral:  Negative for agitation and behavioral problems.    Objective:   Today's Vitals: BP (!) 154/66 (BP Location: Left Arm, Cuff Size: Normal)   Pulse 87   Resp 18   Ht '5\' 2"'  (1.575 m)   Wt 137 lb (62.1 kg)   SpO2 98%   BMI 25.06 kg/m   Physical Exam Vitals reviewed.  Constitutional:      General: She is not in acute distress.    Appearance: She is not diaphoretic.  HENT:     Head: Normocephalic and atraumatic.     Right Ear: Ear canal normal. There is no impacted cerumen.     Left Ear: Ear canal normal. There is no impacted cerumen.     Nose: Nose normal.      Mouth/Throat:     Mouth: Mucous membranes are moist.  Eyes:     General: No scleral icterus.    Extraocular Movements: Extraocular movements intact.  Cardiovascular:     Rate and Rhythm: Normal rate and regular rhythm.     Pulses: Normal pulses.     Heart sounds: Normal heart sounds. No murmur heard. Pulmonary:     Breath sounds: Normal breath sounds. No wheezing or rales.  Abdominal:  Palpations: Abdomen is soft.     Tenderness: There is no abdominal tenderness.  Musculoskeletal:     Cervical back: Neck supple. No tenderness.     Right lower leg: No edema.     Left lower leg: No edema.  Skin:    General: Skin is warm.     Findings: No rash.  Neurological:     General: No focal deficit present.     Mental Status: She is alert and oriented to person, place, and time.  Psychiatric:        Mood and Affect: Mood normal.        Behavior: Behavior normal.    Assessment & Plan:   Problem List Items Addressed This Visit       Encounter to establish care - Primary   Care established Previous chart reviewed History and medications reviewed with the patient     Relevant Orders  CBC with Differential/Platelet  CMP14+EGFR    Cardiovascular and Mediastinum   Essential hypertension    BP Readings from Last 1 Encounters:  11/25/20 (!) 154/66  Uncontrolled, has a component of white coat HTN Advised DASH diet and moderate exercise/walking as tolerated If persistently elevated BP, will add Amlodipine       Other   Sarcoidosis    Was on prednisone in the past No acute flares recently      Vitamin D deficiency    On Vitamin D supplements Check Vitamin D level      Relevant Orders   Vitamin D (25 hydroxy)   Vitamin D 1,25 dihydroxy      Ovarian mass    With metastasis to peritoneum, ?liver and ?lungs Had Gyn Oncology visit - does not want chemotherapy Poor prognosis Wants to have routine blood tests done - will check CMP and free testosterone      Relevant  Orders   Testosterone,Free and Total   Patient is Jehovah's Witness   Other Visit Diagnoses     Refused influenza vaccine           Outpatient Encounter Medications as of 11/25/2020  Medication Sig   Acetylcysteine (NAC PO) Take by mouth.   Ascorbic Acid (VITAMIN C) 1000 MG tablet Take 2,000 mg by mouth daily.   B Complex Vitamins (VITAMIN B COMPLEX) TABS See admin instructions.   cholecalciferol (VITAMIN D) 25 MCG (1000 UNIT) tablet 2-3 tablets   Coenzyme Q10 (CO Q 10) 100 MG CAPS 1 capsule   Cyanocobalamin (VITAMIN B 12 PO) Take 1 tablet by mouth daily.   HORSE CHESTNUT PO Take by mouth.   Magnesium Gluconate POWD Take 400 mg by mouth daily.   Milk Thistle 150 MG CAPS Take 1 capsule by mouth in the morning, at noon, and at bedtime.   Turmeric (QC TUMERIC COMPLEX PO) Take 1,350 mg by mouth daily as needed.   vitamin k 100 MCG tablet 1 tablet   No facility-administered encounter medications on file as of 11/25/2020.    Follow-up: Return in about 3 months (around 02/24/2021) for HTN and ovarian mass.   Lindell Spar, MD

## 2020-11-25 NOTE — Assessment & Plan Note (Signed)
Was on prednisone in the past No acute flares recently

## 2020-11-25 NOTE — Patient Instructions (Signed)
Please continue to take supplements.  Please follow low salt diet and ambulate as tolerated.  Please contact us if you have new symptoms such as dizziness, shortness of breath, or diarrhea.  Please get fasting blood tests done before the next visit.

## 2020-11-25 NOTE — Assessment & Plan Note (Signed)
Care established Previous chart reviewed History and medications reviewed with the patient 

## 2020-11-25 NOTE — Assessment & Plan Note (Signed)
On Vitamin D supplements Check Vitamin D level

## 2020-11-25 NOTE — Assessment & Plan Note (Signed)
BP Readings from Last 1 Encounters:  11/25/20 (!) 154/66   Uncontrolled, has a component of white coat HTN Advised DASH diet and moderate exercise/walking as tolerated

## 2020-11-28 ENCOUNTER — Other Ambulatory Visit: Payer: Self-pay

## 2020-11-28 ENCOUNTER — Ambulatory Visit (INDEPENDENT_AMBULATORY_CARE_PROVIDER_SITE_OTHER): Payer: Medicare Other

## 2020-11-28 DIAGNOSIS — Z0000 Encounter for general adult medical examination without abnormal findings: Secondary | ICD-10-CM

## 2020-11-28 NOTE — Patient Instructions (Signed)
Health Maintenance, Female Adopting a healthy lifestyle and getting preventive care are important in promoting health and wellness. Ask your health care provider about: The right schedule for you to have regular tests and exams. Things you can do on your own to prevent diseases and keep yourself healthy. What should I know about diet, weight, and exercise? Eat a healthy diet  Eat a diet that includes plenty of vegetables, fruits, low-fat dairy products, and lean protein. Do not eat a lot of foods that are high in solid fats, added sugars, or sodium. Maintain a healthy weight Body mass index (BMI) is used to identify weight problems. It estimates body fat based on height and weight. Your health care provider can help determine your BMI and help you achieve or maintain a healthy weight. Get regular exercise Get regular exercise. This is one of the most important things you can do for your health. Most adults should: Exercise for at least 150 minutes each week. The exercise should increase your heart rate and make you sweat (moderate-intensity exercise). Do strengthening exercises at least twice a week. This is in addition to the moderate-intensity exercise. Spend less time sitting. Even light physical activity can be beneficial. Watch cholesterol and blood lipids Have your blood tested for lipids and cholesterol at 69 years of age, then have this test every 5 years. Have your cholesterol levels checked more often if: Your lipid or cholesterol levels are high. You are older than 69 years of age. You are at high risk for heart disease. What should I know about cancer screening? Depending on your health history and family history, you may need to have cancer screening at various ages. This may include screening for: Breast cancer. Cervical cancer. Colorectal cancer. Skin cancer. Lung cancer. What should I know about heart disease, diabetes, and high blood pressure? Blood pressure and heart  disease High blood pressure causes heart disease and increases the risk of stroke. This is more likely to develop in people who have high blood pressure readings, are of African descent, or are overweight. Have your blood pressure checked: Every 3-5 years if you are 18-39 years of age. Every year if you are 40 years old or older. Diabetes Have regular diabetes screenings. This checks your fasting blood sugar level. Have the screening done: Once every three years after age 40 if you are at a normal weight and have a low risk for diabetes. More often and at a younger age if you are overweight or have a high risk for diabetes. What should I know about preventing infection? Hepatitis B If you have a higher risk for hepatitis B, you should be screened for this virus. Talk with your health care provider to find out if you are at risk for hepatitis B infection. Hepatitis C Testing is recommended for: Everyone born from 1945 through 1965. Anyone with known risk factors for hepatitis C. Sexually transmitted infections (STIs) Get screened for STIs, including gonorrhea and chlamydia, if: You are sexually active and are younger than 69 years of age. You are older than 69 years of age and your health care provider tells you that you are at risk for this type of infection. Your sexual activity has changed since you were last screened, and you are at increased risk for chlamydia or gonorrhea. Ask your health care provider if you are at risk. Ask your health care provider about whether you are at high risk for HIV. Your health care provider may recommend a prescription medicine   to help prevent HIV infection. If you choose to take medicine to prevent HIV, you should first get tested for HIV. You should then be tested every 3 months for as long as you are taking the medicine. Pregnancy If you are about to stop having your period (premenopausal) and you may become pregnant, seek counseling before you get  pregnant. Take 400 to 800 micrograms (mcg) of folic acid every day if you become pregnant. Ask for birth control (contraception) if you want to prevent pregnancy. Osteoporosis and menopause Osteoporosis is a disease in which the bones lose minerals and strength with aging. This can result in bone fractures. If you are 65 years old or older, or if you are at risk for osteoporosis and fractures, ask your health care provider if you should: Be screened for bone loss. Take a calcium or vitamin D supplement to lower your risk of fractures. Be given hormone replacement therapy (HRT) to treat symptoms of menopause. Follow these instructions at home: Lifestyle Do not use any products that contain nicotine or tobacco, such as cigarettes, e-cigarettes, and chewing tobacco. If you need help quitting, ask your health care provider. Do not use street drugs. Do not share needles. Ask your health care provider for help if you need support or information about quitting drugs. Alcohol use Do not drink alcohol if: Your health care provider tells you not to drink. You are pregnant, may be pregnant, or are planning to become pregnant. If you drink alcohol: Limit how much you use to 0-1 drink a day. Limit intake if you are breastfeeding. Be aware of how much alcohol is in your drink. In the [place] one drink equals one 12 oz bottle of beer (355 mL), one 5 oz glass of wine (148 mL), or one 1 oz glass of hard liquor (44 mL). General instructions Schedule regular health, dental, and eye exams. Stay current with your vaccines. Tell your health care provider if: You often feel depressed. You have ever been abused or do not feel safe at home. Summary Adopting a healthy lifestyle and getting preventive care are important in promoting health and wellness. Follow your health care provider's instructions about healthy diet, exercising, and getting tested or screened for diseases. Follow your health care provider's  instructions on monitoring your cholesterol and blood pressure. This information is not intended to replace advice given to you by your health care provider. Make sure you discuss any questions you have with your health care provider. Document Revised: 05/03/2020 Document Reviewed: 02/16/2018 Elsevier Patient Education  2022 [employer]  

## 2020-11-28 NOTE — Progress Notes (Signed)
Subjective:   Emma Russell is a 70 y.o. female who presents for an Initial Medicare Annual Wellness Visit. I connected with  Emma Russell on 11/28/20 by a audioenabled telemedicine application and verified that I am speaking with the correct person using two identifiers.   I discussed the limitations of evaluation and management by telemedicine. The patient expressed understanding and agreed to proceed.   Location of patient:Home  Location of Provider:Office  Persons participating in virtual visit: Emma Russell (patient) and Emma Russell Review of Systems    Defer to PCP       Objective:    There were no vitals filed for this visit. There is no height or weight on file to calculate BMI.  Advanced Directives 11/28/2020 09/18/2020 06/30/2017 05/03/2016 05/03/2016  Does Patient Have a Medical Advance Directive? No Yes No No No  Type of Advance Directive - Living will;Healthcare Power of Attorney - - -  Copy of [place] in Chart? - No - copy requested - - -  Would patient like information on creating a medical advance directive? - - - No - Patient declined -    Current Medications (verified) Outpatient Encounter Medications as of 11/28/2020  Medication Sig   Acetylcysteine (NAC PO) Take by mouth.   Ascorbic Acid (VITAMIN C) 1000 MG tablet Take 2,000 mg by mouth daily.   B Complex Vitamins (VITAMIN B COMPLEX) TABS See admin instructions.   cholecalciferol (VITAMIN D) 25 MCG (1000 UNIT) tablet 2-3 tablets   Coenzyme Q10 (CO Q 10) 100 MG CAPS 1 capsule   Cyanocobalamin (VITAMIN B 12 PO) Take 1 tablet by mouth daily.   HORSE CHESTNUT PO Take by mouth.   Magnesium Gluconate POWD Take 400 mg by mouth daily.   Milk Thistle 150 MG CAPS Take 1 capsule by mouth in the morning, at noon, and at bedtime.   Turmeric (QC TUMERIC COMPLEX PO) Take 1,350 mg by mouth daily as needed.   vitamin k 100 MCG tablet 1 tablet   No facility-administered encounter medications on file  as of 11/28/2020.    Allergies (verified) Codeine   History: Past Medical History:  Diagnosis Date   Anxiety    Cataract    COPD (chronic obstructive pulmonary disease) (HCC)    GERD (gastroesophageal reflux disease)    Headache    Hyperlipidemia    Hypertension    Incontinence of urine    Pre-diabetes    Sarcoidosis    Thyromegaly 07/19/2020   Past Surgical History:  Procedure Laterality Date   ORIF WRIST FRACTURE Left 06/30/2017   Procedure: OPEN REDUCTION INTERNAL FIXATION (ORIF) WRIST FRACTURE;  Surgeon: Hiram Gash, MD;  Location: [place];  Service: Orthopedics;  Laterality: Left;   TUBAL LIGATION     Family History  Problem Relation Age of Onset   COPD Mother    Diabetes Mother    Hepatitis C Mother    Alcohol abuse Father    Lung cancer Father    Cancer Maternal Aunt    Uterine cancer Maternal Grandmother    Drug abuse Son 53       overdose   Drug abuse Son    Hepatitis C Son    Breast cancer Neg Hx    Colon cancer Neg Hx    Pancreatic cancer Neg Hx    Prostate cancer Neg Hx    Ovarian cancer Neg Hx    Social History   Socioeconomic History   Marital status: Widowed  Spouse name: Not on file   Number of children: 4   Years of education: 77   Highest education level: Not on file  Occupational History   Occupation: Freight forwarder    Comment: courtyard storage  Tobacco Use   Smoking status: Never    Passive exposure: Past (16years)   Smokeless tobacco: Never  Vaping Use   Vaping Use: Never used  Substance and Sexual Activity   Alcohol use: Not Currently    Comment: occ   Drug use: No   Sexual activity: Not Currently    Birth control/protection: Surgical    Comment: tubal  Other Topics Concern   Not on file  Social History Narrative   Lives at home with grandson Emma Russell has ADHD and anxiety issues   His father Dellis Russell is addicted and comes and goes   Is a widow   Social Determinants of Sales executive: Low Risk     Difficulty of Paying Living Expenses: Not hard at all  Food Insecurity: No Food Insecurity   Worried About Charity fundraiser in the Last Year: Never true   Arboriculturist in the Last Year: Never true  Transportation Needs: No Transportation Needs   Lack of Transportation (Medical): No   Lack of Transportation (Non-Medical): No  Physical Activity: Sufficiently Active   Days of Exercise per Week: 4 days   Minutes of Exercise per Session: 50 min  Stress: No Stress Concern Present   Feeling of Stress : Not at all  Social Connections: Moderately Isolated   Frequency of Communication with Friends and Family: Twice a week   Frequency of Social Gatherings with Friends and Family: Three times a week   Attends Religious Services: More than 4 times per year   Active Member of Clubs or Organizations: No   Attends Archivist Meetings: Patient refused   Marital Status: Widowed    Tobacco Counseling Counseling given: Not Answered   Clinical Intake:  Pre-visit preparation completed: Yes  Pain : No/denies pain     Diabetes: No  How often do you need to have someone help you when you read instructions, pamphlets, or other written materials from your doctor or pharmacy?: 1 - Never  Diabetic?NO  Interpreter Needed?: No  Information entered by :: Emma Russell J,CMA   Activities of Daily Living In your present state of health, do you have any difficulty performing the following activities: 11/25/2020  Hearing? N  Vision? N  Difficulty concentrating or making decisions? N  Walking or climbing stairs? N  Dressing or bathing? N  Doing errands, shopping? N  Some recent data might be hidden    Patient Care Team: Lindell Spar, MD as PCP - General (Internal Medicine)  Indicate any recent Medical Services you may have received from other than [facility] providers in the past year (date may be approximate).     Assessment:   This is a routine wellness examination for  Emma Russell.  Hearing/Vision screen No results found.  Dietary issues and exercise activities discussed:     Goals Addressed   None    Depression Screen PHQ 2/9 Scores 11/28/2020 11/25/2020 08/28/2020 05/27/2016  PHQ - 2 Score 0 0 0 0  PHQ- 9 Score - - 0 -    Fall Risk Fall Risk  11/25/2020 08/28/2020 05/27/2016  Falls in the past year? 0 0 No  Number falls in past yr: 0 0 -  Injury with Fall? 0 0 -  Risk for fall due to : No Fall Risks No Fall Risks -  Follow up Falls evaluation completed - -    FALL RISK PREVENTION PERTAINING TO THE HOME:  Any stairs in or around the home? Yes  If so, are there any without handrails? No  Home free of loose throw rugs in walkways, pet beds, electrical cords, etc? Yes  Adequate lighting in your home to reduce risk of falls? Yes   ASSISTIVE DEVICES UTILIZED TO PREVENT FALLS:  Life alert? No  Use of a cane, walker or w/c? No  Grab bars in the bathroom? No  Shower chair or bench in shower? No  Elevated toilet seat or a handicapped toilet? No   TIMED UP AND GO:  Was the test performed?  N/A .  Length of time to ambulate 10 feet: N/A sec.     Cognitive Function:     6CIT Screen 11/28/2020  What Year? 0 points  What month? 0 points  What time? 0 points  Count back from 20 0 points  Months in reverse 0 points  Repeat phrase 0 points  Total Score 0    Immunizations  There is no immunization history on file for this patient.  TDAP status: Due, Education has been provided regarding the importance of this vaccine. Advised may receive this vaccine at local pharmacy or Health Dept. Aware to provide a copy of the vaccination record if obtained from local pharmacy or Health Dept. Verbalized acceptance and understanding.  Flu Vaccine status: Due, Education has been provided regarding the importance of this vaccine. Advised may receive this vaccine at local pharmacy or Health Dept. Aware to provide a copy of the vaccination record if obtained  from local pharmacy or Health Dept. Verbalized acceptance and understanding.    Covid-19 vaccine status: Information provided on how to obtain vaccines.   Qualifies for Shingles Vaccine? Yes   Zostavax completed No   Shingrix Completed?: No.    Education has been provided regarding the importance of this vaccine. Patient has been advised to call insurance company to determine out of pocket expense if they have not yet received this vaccine. Advised may also receive vaccine at local pharmacy or Health Dept. Verbalized acceptance and understanding.  Screening Tests Health Maintenance  Topic Date Due   COVID-19 Vaccine (1) Never done   TETANUS/TDAP  Never done   Zoster Vaccines- Shingrix (1 of 2) Never done   COLONOSCOPY (Pts 45-34yrs Insurance coverage will need to be confirmed)  Never done   INFLUENZA VACCINE  Never done   MAMMOGRAM  04/01/2022   DEXA SCAN  Completed   Hepatitis C Screening  Completed   HPV VACCINES  Aged Out    Health Maintenance  Health Maintenance Due  Topic Date Due   COVID-19 Vaccine (1) Never done   TETANUS/TDAP  Never done   Zoster Vaccines- Shingrix (1 of 2) Never done   COLONOSCOPY (Pts 45-61yrs Insurance coverage will need to be confirmed)  Never done   INFLUENZA VACCINE  Never done    Colorectal Cancer Screening: patient stated she has colon cancer  Mammogram status: Completed 04/01/2020. Repeat every year  Bone Density status: Completed 11/23/2007. Results reflect: Bone density results: OSTEOPENIA. Repeat every 2 years.  Lung Cancer Screening: (Low Dose CT Chest recommended if Age 50-80 years, 30 pack-year currently smoking OR have quit w/in 15years.) does not qualify.   Lung Cancer Screening Referral: NO  Additional Screening:  Hepatitis C Screening: does qualify; Completed 05/27/2016  Vision Screening: Recommended annual ophthalmology exams for early detection of glaucoma and other disorders of the eye. Is the patient up to date with their  annual eye exam?  No  Who is the provider or what is the name of the office in which the patient attends annual eye exams? N/A If pt is not established with a provider, would they like to be referred to a provider to establish care? No .   Dental Screening: Recommended annual dental exams for proper oral hygiene  Community Resource Referral / Chronic Care Management: CRR required this visit?  No   CCM required this visit?  No      Plan:     I have personally reviewed and noted the following in the patient's chart:   Medical and social history Use of alcohol, tobacco or illicit drugs  Current medications and supplements including opioid prescriptions. Patient is not currently taking opioid prescriptions. Functional ability and status Nutritional status Physical activity Advanced directives List of other physicians Hospitalizations, surgeries, and ER visits in previous 12 months Vitals Screenings to include cognitive, depression, and falls Referrals and appointments  In addition, I have reviewed and discussed with patient certain preventive protocols, quality metrics, and best practice recommendations. A written personalized care plan for preventive services as well as general preventive health recommendations were provided to patient.     Edgar Frisk, [facility]   11/28/2020   Nurse Notes: Non Face to Face 30 minute visit    Ms. Tietje , Thank you for taking time to come for your Medicare Wellness Visit. I appreciate your ongoing commitment to your health goals. Please review the following plan we discussed and let me know if I can assist you in the future.   These are the goals we discussed:  Goals      Blood Pressure < 140/90     Weight (lb) < 150 lb (68 kg)        This is a list of the screening recommended for you and due dates:  Health Maintenance  Topic Date Due   COVID-19 Vaccine (1) Never done   Tetanus Vaccine  Never done   Zoster (Shingles) Vaccine (1 of 2)  Never done   Colon Cancer Screening  Never done   Flu Shot  Never done   Mammogram  04/01/2022   DEXA scan (bone density measurement)  Completed   Hepatitis C Screening: USPSTF Recommendation to screen - Ages 72-79 yo.  Completed   HPV Vaccine  Aged Out

## 2020-12-03 ENCOUNTER — Telehealth: Payer: Self-pay | Admitting: Internal Medicine

## 2020-12-03 ENCOUNTER — Encounter: Payer: Self-pay | Admitting: Internal Medicine

## 2020-12-03 ENCOUNTER — Other Ambulatory Visit: Payer: Self-pay | Admitting: *Deleted

## 2020-12-03 DIAGNOSIS — C569 Malignant neoplasm of unspecified ovary: Secondary | ICD-10-CM

## 2020-12-03 NOTE — Telephone Encounter (Signed)
Called in looking for a prescription for hair loss to present for a wig.

## 2020-12-03 NOTE — Telephone Encounter (Signed)
Pt stopped by she couldn't understand the caller.  Pt just needs a RX for a wig, dx cancer

## 2020-12-03 NOTE — Telephone Encounter (Signed)
Advised pt to call and see if they accept a letter or have paperwork to fill out as we have never done a prescription for a wig she will send mychart message and let us know

## 2020-12-03 NOTE — Telephone Encounter (Signed)
Called in reference to a prescription for hair loss due to cancer to present for making of a wig.

## 2020-12-04 ENCOUNTER — Other Ambulatory Visit: Payer: Self-pay | Admitting: *Deleted

## 2020-12-04 DIAGNOSIS — N838 Other noninflammatory disorders of ovary, fallopian tube and broad ligament: Secondary | ICD-10-CM

## 2020-12-04 MED ORDER — UNABLE TO FIND
1.0000 | Freq: Once | 0 refills | Status: AC
Start: 2020-12-04 — End: 2020-12-04

## 2020-12-18 ENCOUNTER — Telehealth: Payer: Self-pay | Admitting: Nurse Practitioner

## 2020-12-18 ENCOUNTER — Other Ambulatory Visit: Payer: Medicare Other | Admitting: Nurse Practitioner

## 2020-12-18 ENCOUNTER — Other Ambulatory Visit: Payer: Self-pay

## 2020-12-18 NOTE — Telephone Encounter (Signed)
I called Emma Russell for scheduled f/u pc visit, no answer, message left with contact information

## 2020-12-24 DIAGNOSIS — H16223 Keratoconjunctivitis sicca, not specified as Sjogren's, bilateral: Secondary | ICD-10-CM | POA: Diagnosis not present

## 2020-12-24 DIAGNOSIS — H35371 Puckering of macula, right eye: Secondary | ICD-10-CM | POA: Diagnosis not present

## 2020-12-24 DIAGNOSIS — H5213 Myopia, bilateral: Secondary | ICD-10-CM | POA: Diagnosis not present

## 2020-12-24 DIAGNOSIS — H524 Presbyopia: Secondary | ICD-10-CM | POA: Diagnosis not present

## 2020-12-24 DIAGNOSIS — H52223 Regular astigmatism, bilateral: Secondary | ICD-10-CM | POA: Diagnosis not present

## 2020-12-24 DIAGNOSIS — H2513 Age-related nuclear cataract, bilateral: Secondary | ICD-10-CM | POA: Diagnosis not present

## 2020-12-28 ENCOUNTER — Encounter: Payer: Self-pay | Admitting: Internal Medicine

## 2020-12-30 ENCOUNTER — Telehealth: Payer: Self-pay | Admitting: Nurse Practitioner

## 2020-12-30 NOTE — Addendum Note (Signed)
Addended byIhor Dow on: 12/30/2020 05:03 PM   Modules accepted: Level of Service

## 2020-12-30 NOTE — Telephone Encounter (Signed)
Ret'd call to patient and have scheduled Telehealth Palliative Consult for 01/14/21 @ 3:30 PM

## 2021-01-06 DIAGNOSIS — H43393 Other vitreous opacities, bilateral: Secondary | ICD-10-CM | POA: Diagnosis not present

## 2021-01-06 DIAGNOSIS — H35373 Puckering of macula, bilateral: Secondary | ICD-10-CM | POA: Diagnosis not present

## 2021-01-06 DIAGNOSIS — H25813 Combined forms of age-related cataract, bilateral: Secondary | ICD-10-CM | POA: Diagnosis not present

## 2021-01-06 DIAGNOSIS — H16402 Unspecified corneal neovascularization, left eye: Secondary | ICD-10-CM | POA: Diagnosis not present

## 2021-01-14 ENCOUNTER — Telehealth: Payer: Self-pay | Admitting: Nurse Practitioner

## 2021-01-14 ENCOUNTER — Other Ambulatory Visit: Payer: Medicare Other | Admitting: Nurse Practitioner

## 2021-01-14 ENCOUNTER — Other Ambulatory Visit: Payer: Self-pay

## 2021-01-14 NOTE — Telephone Encounter (Signed)
I called Ms. Emma Russell for pallaitive consult for telemedicine telephonic as video not available. No answer, message left with contact information. Ms. Emma Russell returned call at 3:30pm though provider at that time on emergency calll, Ms. Emma Russell left a message that she would wait 5 more minutes for return call where she has Internet service for her phone. I return call once call completed, message left to return all

## 2021-01-15 ENCOUNTER — Telehealth: Payer: Self-pay | Admitting: Nurse Practitioner

## 2021-01-15 NOTE — Telephone Encounter (Signed)
I called Ms. Bear, rescheduled PC visit.

## 2021-01-20 DIAGNOSIS — H25813 Combined forms of age-related cataract, bilateral: Secondary | ICD-10-CM | POA: Diagnosis not present

## 2021-01-20 DIAGNOSIS — H25811 Combined forms of age-related cataract, right eye: Secondary | ICD-10-CM | POA: Diagnosis not present

## 2021-01-28 DIAGNOSIS — H25811 Combined forms of age-related cataract, right eye: Secondary | ICD-10-CM | POA: Diagnosis not present

## 2021-02-05 DIAGNOSIS — H25812 Combined forms of age-related cataract, left eye: Secondary | ICD-10-CM | POA: Diagnosis not present

## 2021-02-21 NOTE — Telephone Encounter (Signed)
Appt canceled!

## 2021-02-24 ENCOUNTER — Ambulatory Visit: Payer: Medicare Other | Admitting: Internal Medicine

## 2021-04-02 ENCOUNTER — Telehealth: Payer: Self-pay

## 2021-04-02 NOTE — Telephone Encounter (Signed)
1145 am.  Phone call made to patient to complete a check-in and schedule a home visit with NP.  No answer.  Message left requesting a call back.

## 2021-04-16 ENCOUNTER — Other Ambulatory Visit: Payer: Medicare Other

## 2021-04-16 ENCOUNTER — Other Ambulatory Visit: Payer: Self-pay

## 2021-04-16 DIAGNOSIS — Z515 Encounter for palliative care: Secondary | ICD-10-CM

## 2021-04-16 NOTE — Progress Notes (Signed)
PATIENT NAME: Emma Russell DOB: 1950/04/20 MRN: [id number]  PRIMARY CARE PROVIDER: Lindell Spar, MD  RESPONSIBLE PARTY:  Acct ID - Guarantor Home Phone Work Phone Relationship Acct Type  [id number] Emma, [name]  Self P/F     [address], [place], [place] [address]   Due to the COVID-19 crisis, this visit was done via telemedicine from my office and it was initiated and consent by this patient and or family.  I connected with  Emma Russell OR PROXY on 04/16/21 by telephone and verified that I am speaking with the correct person using two identifiers.   I discussed the limitations of evaluation and management by telemedicine. The patient expressed understanding and agreed to proceed.   Follow up phone call made to patient to offer [facility] NP visit.  Patient did not feel she needed services at this time but states she may in the future.  She had questions about Palliative Care vs Hospice.  We discussed at length the differences between the services.     Patient is agreeable to continue with Palliative Care services and have phone check ins every 2 months.  She is aware she can contact us should a home visit be needed.   ACP:  Discussed MOST and code status.  Patient is currently a full code.  She would like to think about this more and possibly consult with her provider.  Appetite:  Patient endorses a good appetite.  She does not feel she has lost any more weight.  Currently wearing the same clothes as she was about 5 months ago and they are not loose fitting on her.   Anxiety:  No issues with anxiety.  Exercise:  Patient endorse she is walking on a regular basis to maintain her mobility.   Nausea:  Patient endorses occasional nausea without vomiting. She is not currently utilizing any medications to address this.  Advised medications could be prescribed if nausea worsens or becomes bothersome.  Shortness of Breath:  Remains at her baseline.  She notes she does have COPD but  this has not worsened.   Update provided to Emma Leatherwood, NP.      Emma Burton, RN

## 2021-06-25 DIAGNOSIS — I872 Venous insufficiency (chronic) (peripheral): Secondary | ICD-10-CM | POA: Diagnosis not present

## 2021-06-25 DIAGNOSIS — L308 Other specified dermatitis: Secondary | ICD-10-CM | POA: Diagnosis not present

## 2021-08-20 ENCOUNTER — Telehealth: Payer: Self-pay

## 2021-08-20 NOTE — Telephone Encounter (Signed)
PC SW outreached patient to complete telephonic check in.  Call unsuccessful. SW unable to LVM due to line being busy. PC will attempt to reach patient at a later time/date.

## 2021-08-27 ENCOUNTER — Encounter: Payer: Self-pay | Admitting: Internal Medicine

## 2021-08-27 ENCOUNTER — Ambulatory Visit (INDEPENDENT_AMBULATORY_CARE_PROVIDER_SITE_OTHER): Payer: Medicare Other | Admitting: Internal Medicine

## 2021-08-27 VITALS — BP 122/80 | HR 98 | Resp 18 | Ht 63.0 in | Wt 140.8 lb

## 2021-08-27 DIAGNOSIS — I890 Lymphedema, not elsewhere classified: Secondary | ICD-10-CM

## 2021-08-27 DIAGNOSIS — R197 Diarrhea, unspecified: Secondary | ICD-10-CM | POA: Diagnosis not present

## 2021-08-27 DIAGNOSIS — N838 Other noninflammatory disorders of ovary, fallopian tube and broad ligament: Secondary | ICD-10-CM | POA: Diagnosis not present

## 2021-08-27 MED ORDER — FUROSEMIDE 20 MG PO TABS: 20.0000 mg | ORAL_TABLET | Freq: Every day | ORAL | 0 refills | Status: DC | PRN

## 2021-08-27 NOTE — Patient Instructions (Signed)
Please take Lasix as needed for leg swelling.  Please try to keep legs elevated. Please use compression stocking for swelling.  Please maintain adequate hydration by taking at least 50 ounces of fluid in a day.  Please contact your Gynecologist/Oncologist for discussion of ovarian mass treatment.

## 2021-08-29 ENCOUNTER — Ambulatory Visit: Payer: Medicare Other | Admitting: Family Medicine

## 2021-08-29 ENCOUNTER — Telehealth: Payer: Self-pay | Admitting: Internal Medicine

## 2021-08-30 DIAGNOSIS — I890 Lymphedema, not elsewhere classified: Secondary | ICD-10-CM | POA: Insufficient documentation

## 2021-09-02 ENCOUNTER — Telehealth: Payer: Self-pay

## 2021-09-02 NOTE — Telephone Encounter (Signed)
Called patient no answer sent mychart message

## 2021-09-02 NOTE — Telephone Encounter (Signed)
Called patient placard ready for pick up. Patient will pick up.

## 2021-09-02 NOTE — Telephone Encounter (Signed)
Patient called waiting on the referral for a Lymphedema massage. Has not heard anything yet was told our office would call to schedule. Patient call back # [phone].

## 2021-09-10 ENCOUNTER — Ambulatory Visit: Payer: Self-pay

## 2021-09-10 NOTE — Telephone Encounter (Signed)
  Pt. Mixed a cap full of peroxide with water for wound care and accidentally swallowed some. Called Poison Control and Almyra Free states pt. Should be fine, may cause some vomiting. Instructed to go to ED for worsening of symptoms.  Reason for Disposition  Triager unable to answer question  Answer Assessment - Initial Assessment Questions 1. SUBSTANCE: "What was swallowed?" If necessary, have the caller look at the label on the container.      Hydrogen peroxide 2. AMOUNT: "How much was swallowed?" (Err on the side of recording the maximal amount that is missing)      Small cap full 3. ONSET: "When was it probably swallowed?" (Minutes or hours ago)      Today 4. SYMPTOMS: "Do you have any symptoms?" If Yes, ask: "What are they?" (e.g., abdominal pain, vomiting, weakness)      None 5. SUICIDAL: "Did you take this to hurt or kill yourself?"     No 6. PREGNANCY: "Is there any chance you are pregnant?" "When was your last menstrual period?"     No  Protocols used: Poisoning-A-AH

## 2021-09-12 ENCOUNTER — Other Ambulatory Visit: Payer: Self-pay

## 2021-09-12 ENCOUNTER — Emergency Department (HOSPITAL_COMMUNITY)
Admission: EM | Admit: 2021-09-12 | Discharge: 2021-09-12 | Disposition: A | Discharge status: Home / Self Care | Payer: Medicare Other | Source: Home / Self Care | Attending: Emergency Medicine | Admitting: Emergency Medicine

## 2021-09-12 ENCOUNTER — Encounter (HOSPITAL_COMMUNITY): Payer: Self-pay | Admitting: Emergency Medicine

## 2021-09-12 ENCOUNTER — Telehealth: Payer: Self-pay | Admitting: Radiology

## 2021-09-12 ENCOUNTER — Emergency Department (HOSPITAL_COMMUNITY): Payer: Medicare Other

## 2021-09-12 DIAGNOSIS — W108XXA Fall (on) (from) other stairs and steps, initial encounter: Secondary | ICD-10-CM | POA: Insufficient documentation

## 2021-09-12 DIAGNOSIS — J811 Chronic pulmonary edema: Secondary | ICD-10-CM | POA: Diagnosis not present

## 2021-09-12 DIAGNOSIS — C786 Secondary malignant neoplasm of retroperitoneum and peritoneum: Secondary | ICD-10-CM | POA: Diagnosis not present

## 2021-09-12 DIAGNOSIS — I10 Essential (primary) hypertension: Secondary | ICD-10-CM | POA: Diagnosis not present

## 2021-09-12 DIAGNOSIS — Z515 Encounter for palliative care: Secondary | ICD-10-CM | POA: Diagnosis not present

## 2021-09-12 DIAGNOSIS — S42012A Anterior displaced fracture of sternal end of left clavicle, initial encounter for closed fracture: Secondary | ICD-10-CM | POA: Insufficient documentation

## 2021-09-12 DIAGNOSIS — E860 Dehydration: Secondary | ICD-10-CM | POA: Diagnosis not present

## 2021-09-12 DIAGNOSIS — J90 Pleural effusion, not elsewhere classified: Secondary | ICD-10-CM | POA: Diagnosis not present

## 2021-09-12 DIAGNOSIS — N179 Acute kidney failure, unspecified: Secondary | ICD-10-CM | POA: Diagnosis not present

## 2021-09-12 DIAGNOSIS — I9589 Other hypotension: Secondary | ICD-10-CM | POA: Diagnosis not present

## 2021-09-12 DIAGNOSIS — C787 Secondary malignant neoplasm of liver and intrahepatic bile duct: Secondary | ICD-10-CM | POA: Diagnosis not present

## 2021-09-12 DIAGNOSIS — S42032A Displaced fracture of lateral end of left clavicle, initial encounter for closed fracture: Secondary | ICD-10-CM

## 2021-09-12 DIAGNOSIS — R600 Localized edema: Secondary | ICD-10-CM | POA: Diagnosis not present

## 2021-09-12 DIAGNOSIS — E440 Moderate protein-calorie malnutrition: Secondary | ICD-10-CM | POA: Diagnosis not present

## 2021-09-12 DIAGNOSIS — J449 Chronic obstructive pulmonary disease, unspecified: Secondary | ICD-10-CM | POA: Diagnosis not present

## 2021-09-12 DIAGNOSIS — R601 Generalized edema: Secondary | ICD-10-CM | POA: Diagnosis not present

## 2021-09-12 DIAGNOSIS — C7989 Secondary malignant neoplasm of other specified sites: Secondary | ICD-10-CM | POA: Diagnosis not present

## 2021-09-12 DIAGNOSIS — I700 Atherosclerosis of aorta: Secondary | ICD-10-CM | POA: Diagnosis not present

## 2021-09-12 DIAGNOSIS — C569 Malignant neoplasm of unspecified ovary: Secondary | ICD-10-CM | POA: Diagnosis present

## 2021-09-12 DIAGNOSIS — R54 Age-related physical debility: Secondary | ICD-10-CM | POA: Diagnosis not present

## 2021-09-12 DIAGNOSIS — Z66 Do not resuscitate: Secondary | ICD-10-CM | POA: Diagnosis not present

## 2021-09-12 DIAGNOSIS — E872 Acidosis, unspecified: Secondary | ICD-10-CM | POA: Diagnosis not present

## 2021-09-12 DIAGNOSIS — R6521 Severe sepsis with septic shock: Secondary | ICD-10-CM | POA: Diagnosis not present

## 2021-09-12 DIAGNOSIS — R627 Adult failure to thrive: Secondary | ICD-10-CM | POA: Diagnosis not present

## 2021-09-12 DIAGNOSIS — E861 Hypovolemia: Secondary | ICD-10-CM | POA: Diagnosis not present

## 2021-09-12 DIAGNOSIS — I959 Hypotension, unspecified: Secondary | ICD-10-CM | POA: Diagnosis not present

## 2021-09-12 DIAGNOSIS — K219 Gastro-esophageal reflux disease without esophagitis: Secondary | ICD-10-CM | POA: Diagnosis not present

## 2021-09-12 DIAGNOSIS — R0600 Dyspnea, unspecified: Secondary | ICD-10-CM | POA: Diagnosis not present

## 2021-09-12 DIAGNOSIS — I890 Lymphedema, not elsewhere classified: Secondary | ICD-10-CM | POA: Diagnosis not present

## 2021-09-12 DIAGNOSIS — N2889 Other specified disorders of kidney and ureter: Secondary | ICD-10-CM | POA: Diagnosis not present

## 2021-09-12 DIAGNOSIS — C799 Secondary malignant neoplasm of unspecified site: Secondary | ICD-10-CM | POA: Diagnosis not present

## 2021-09-12 DIAGNOSIS — A419 Sepsis, unspecified organism: Secondary | ICD-10-CM | POA: Diagnosis not present

## 2021-09-12 DIAGNOSIS — W109XXA Fall (on) (from) unspecified stairs and steps, initial encounter: Secondary | ICD-10-CM | POA: Diagnosis present

## 2021-09-12 DIAGNOSIS — E785 Hyperlipidemia, unspecified: Secondary | ICD-10-CM | POA: Diagnosis not present

## 2021-09-12 DIAGNOSIS — Y92009 Unspecified place in unspecified non-institutional (private) residence as the place of occurrence of the external cause: Secondary | ICD-10-CM | POA: Diagnosis not present

## 2021-09-12 DIAGNOSIS — E8809 Other disorders of plasma-protein metabolism, not elsewhere classified: Secondary | ICD-10-CM | POA: Diagnosis not present

## 2021-09-12 DIAGNOSIS — J9811 Atelectasis: Secondary | ICD-10-CM | POA: Diagnosis not present

## 2021-09-12 DIAGNOSIS — J479 Bronchiectasis, uncomplicated: Secondary | ICD-10-CM | POA: Diagnosis not present

## 2021-09-12 DIAGNOSIS — D649 Anemia, unspecified: Secondary | ICD-10-CM | POA: Diagnosis not present

## 2021-09-12 DIAGNOSIS — N390 Urinary tract infection, site not specified: Secondary | ICD-10-CM | POA: Diagnosis not present

## 2021-09-12 DIAGNOSIS — R188 Other ascites: Secondary | ICD-10-CM | POA: Diagnosis not present

## 2021-09-12 DIAGNOSIS — Z7189 Other specified counseling: Secondary | ICD-10-CM | POA: Diagnosis not present

## 2021-09-12 DIAGNOSIS — Z6826 Body mass index (BMI) 26.0-26.9, adult: Secondary | ICD-10-CM | POA: Diagnosis not present

## 2021-09-12 DIAGNOSIS — R918 Other nonspecific abnormal finding of lung field: Secondary | ICD-10-CM | POA: Diagnosis not present

## 2021-09-12 DIAGNOSIS — W19XXXA Unspecified fall, initial encounter: Secondary | ICD-10-CM

## 2021-09-12 NOTE — ED Triage Notes (Signed)
Pt tripped and fell down about 3 steps pta. A/o. Walking with cane. Skin tear noted to right lateral wrist, left post arm above elbow and c/o left shoulder pain. Denies hitting head or LOC. No obvious deformities noted. Chronic ble swelling noted with redness.

## 2021-09-12 NOTE — Discharge Instructions (Addendum)
Please take Tylenol for pain.  You can also alternate with ibuprofen.  Please take these medications with food as it can irritate your stomach.  Like for you to follow-up with Dr. Amedeo Kinsman with orthopedics for further evaluation.  Please return to the emergency room for any worsening symptoms.  I would recommend you keep the arm in the sling for support and comfort until you follow-up with Dr. Amedeo Kinsman.

## 2021-09-12 NOTE — ED Provider Notes (Signed)
[facility] EMERGENCY DEPARTMENT Provider Note   CSN: [id number] Arrival date & time: 09/12/21  1139     History Chief Complaint  Patient presents with   Emma Russell is a 71 y.o. female patient presents to the emergency permit for further evaluation of left shoulder pain after a mechanical trip and fall down the stairs this morning.  Patient states that she was walking on the stairs with loss of balance and fell onto the left shoulder.  She has had pain since then.  She denies hitting her head or any other injury.  Patient is not anticoagulated.   Fall       Home Medications Prior to Admission medications   Medication Sig Start Date End Date Taking? Authorizing Provider  Acetylcysteine (NAC PO) Take by mouth.    [provider]  Ascorbic Acid (VITAMIN C) 1000 MG tablet Take 2,000 mg by mouth daily.    [provider]  B Complex Vitamins (VITAMIN B COMPLEX) TABS See admin instructions.    [provider]  betamethasone dipropionate 0.05 % cream Apply topically 2 (two) times daily.    [provider]  cholecalciferol (VITAMIN D) 25 MCG (1000 UNIT) tablet 2-3 tablets    [provider]  Coenzyme Q10 (CO Q 10) 100 MG CAPS 1 capsule    [provider]  Cyanocobalamin (VITAMIN B 12 PO) Take 1 tablet by mouth daily.    [provider]  furosemide (LASIX) 20 MG tablet Take 1 tablet (20 mg total) by mouth daily as needed for edema. 08/27/21   Lindell Spar, MD  HORSE CHESTNUT PO Take by mouth.    [provider]  Magnesium Gluconate POWD Take 400 mg by mouth daily.    [provider]  Milk Thistle 150 MG CAPS Take 1 capsule by mouth in the morning, at noon, and at bedtime.    [provider]  Turmeric (QC TUMERIC COMPLEX PO) Take 1,350 mg by mouth daily as needed.    [provider]  vitamin k 100 MCG tablet 1 tablet    [provider]      Allergies    Codeine     Review of Systems   Review of Systems  All other systems reviewed and are negative.   Physical Exam Updated Vital Signs BP 137/63 (BP Location: Right Arm)   Pulse 89   Temp 97.8 F (36.6 C) (Oral)   Resp 18   SpO2 100%  Physical Exam Vitals and nursing note reviewed.  Constitutional:      Appearance: Normal appearance.  HENT:     Head: Normocephalic and atraumatic.  Eyes:     General:        Right eye: No discharge.        Left eye: No discharge.     Conjunctiva/sclera: Conjunctivae normal.  Pulmonary:     Effort: Pulmonary effort is normal.  Musculoskeletal:     Comments: Palpable deformity over the distal lateral clavicle.  No evidence of skin tenting.  Patient has limited range of motion of the shoulder secondary to pain and deformity.  2+ radial pulse felt on the left arm.  Good cap refill.  Good grip strength.  Neurovascular intact.  Compartments soft in the arm.  Skin:    General: Skin is warm and dry.     Findings: No rash.  Neurological:     General: No focal deficit present.  Mental Status: She is alert.  Psychiatric:        Mood and Affect: Mood normal.        Behavior: Behavior normal.     ED Results / Procedures / Treatments   Labs (all labs ordered are listed, but only abnormal results are displayed) Labs Reviewed - No data to display  EKG None  Radiology DG Shoulder Left  Result Date: 09/12/2021 CLINICAL DATA:  Fall.  Left shoulder pain. EXAM: LEFT SHOULDER - 2+ VIEW COMPARISON:  None Available. FINDINGS: No evidence for shoulder dislocation. No shoulder separation. There is a short oblique fracture of the distal clavicle near the acromioclavicular joint. Bones are diffusely demineralized. IMPRESSION: Distal left clavicle fracture. Electronically Signed   By: Misty Stanley M.D.   On: 09/12/2021 12:22    Procedures Procedures    Medications Ordered in ED Medications - No data to display  ED Course/ Medical Decision Making/ A&P                            Medical Decision Making Emma Russell is a 71 y.o. female patient who presents to the emerged from today for further evaluation of left shoulder pain after a fall.  I am concerned for possible clavicle fracture considering the deformity over the left shoulder.  Patient offered pain medication which she declined.  I personally ordered and interpreted the x-ray of the left shoulder which did show a left clavicle fracture.  I do agree with the radiologist interpretation.  I Georgina Peer have her follow-up with orthopedics for further management and surgical evaluation.  I have given her a sling for comfort and support of the arm and instructed her to leave that on until she follows up with orthopedics.  She was encouraged to return to the emergency room if any worsening symptoms.  She is safe for discharge.   Amount and/or Complexity of Data Reviewed Radiology: ordered and independent interpretation performed.   Final Clinical Impression(s) / ED Diagnoses Final diagnoses:  Closed displaced fracture of acromial end of left clavicle, initial encounter  Fall, initial encounter    Rx / DC Orders ED Discharge Orders     None         Cherrie Gauze 09/12/21 1328    Milton Ferguson, MD 09/12/21 [phone]

## 2021-09-12 NOTE — Telephone Encounter (Signed)
Patient called in for an appointment I told her a time, but it was not correct, I thought I was looking at Dr Amedeo Kinsman schedule instead I was in Dr Luna Glasgow schedule, I tried calling her back, but she did not answer Where can I put her for her clavicle fracture? I told her on Thursday, but was on [place] schedule, Dr Amedeo Kinsman is not even here on Thursday.   I get a busy signal when I call.

## 2021-09-12 NOTE — Telephone Encounter (Signed)
Cancel this request I did get her on the phone and Arbie Cookey helped me get her scheduled in [place].  Thanks

## 2021-09-15 ENCOUNTER — Other Ambulatory Visit: Payer: Self-pay

## 2021-09-15 ENCOUNTER — Emergency Department (HOSPITAL_COMMUNITY): Payer: Medicare Other

## 2021-09-15 ENCOUNTER — Inpatient Hospital Stay (HOSPITAL_COMMUNITY)
Admission: EM | Admit: 2021-09-15 | Discharge: 2021-09-19 | DRG: 871 | Disposition: A | Discharge status: Hospice | Payer: Medicare Other | Attending: Family Medicine | Admitting: Family Medicine

## 2021-09-15 ENCOUNTER — Encounter (HOSPITAL_COMMUNITY): Payer: Self-pay

## 2021-09-15 DIAGNOSIS — S42009A Fracture of unspecified part of unspecified clavicle, initial encounter for closed fracture: Secondary | ICD-10-CM | POA: Diagnosis present

## 2021-09-15 DIAGNOSIS — N179 Acute kidney failure, unspecified: Secondary | ICD-10-CM

## 2021-09-15 DIAGNOSIS — Z66 Do not resuscitate: Secondary | ICD-10-CM

## 2021-09-15 DIAGNOSIS — E861 Hypovolemia: Secondary | ICD-10-CM | POA: Diagnosis present

## 2021-09-15 DIAGNOSIS — E440 Moderate protein-calorie malnutrition: Secondary | ICD-10-CM | POA: Diagnosis present

## 2021-09-15 DIAGNOSIS — J449 Chronic obstructive pulmonary disease, unspecified: Secondary | ICD-10-CM | POA: Diagnosis present

## 2021-09-15 DIAGNOSIS — R627 Adult failure to thrive: Secondary | ICD-10-CM | POA: Diagnosis present

## 2021-09-15 DIAGNOSIS — Z7189 Other specified counseling: Secondary | ICD-10-CM | POA: Diagnosis not present

## 2021-09-15 DIAGNOSIS — I890 Lymphedema, not elsewhere classified: Secondary | ICD-10-CM | POA: Diagnosis present

## 2021-09-15 DIAGNOSIS — S42032A Displaced fracture of lateral end of left clavicle, initial encounter for closed fracture: Secondary | ICD-10-CM | POA: Diagnosis present

## 2021-09-15 DIAGNOSIS — E860 Dehydration: Secondary | ICD-10-CM | POA: Diagnosis present

## 2021-09-15 DIAGNOSIS — R6521 Severe sepsis with septic shock: Secondary | ICD-10-CM | POA: Diagnosis not present

## 2021-09-15 DIAGNOSIS — Z8049 Family history of malignant neoplasm of other genital organs: Secondary | ICD-10-CM

## 2021-09-15 DIAGNOSIS — C799 Secondary malignant neoplasm of unspecified site: Secondary | ICD-10-CM

## 2021-09-15 DIAGNOSIS — Z6826 Body mass index (BMI) 26.0-26.9, adult: Secondary | ICD-10-CM

## 2021-09-15 DIAGNOSIS — E8809 Other disorders of plasma-protein metabolism, not elsewhere classified: Secondary | ICD-10-CM | POA: Diagnosis present

## 2021-09-15 DIAGNOSIS — E872 Acidosis, unspecified: Secondary | ICD-10-CM | POA: Diagnosis present

## 2021-09-15 DIAGNOSIS — K219 Gastro-esophageal reflux disease without esophagitis: Secondary | ICD-10-CM | POA: Diagnosis present

## 2021-09-15 DIAGNOSIS — E162 Hypoglycemia, unspecified: Secondary | ICD-10-CM | POA: Diagnosis present

## 2021-09-15 DIAGNOSIS — A419 Sepsis, unspecified organism: Secondary | ICD-10-CM | POA: Diagnosis not present

## 2021-09-15 DIAGNOSIS — N838 Other noninflammatory disorders of ovary, fallopian tube and broad ligament: Secondary | ICD-10-CM | POA: Diagnosis present

## 2021-09-15 DIAGNOSIS — Z825 Family history of asthma and other chronic lower respiratory diseases: Secondary | ICD-10-CM

## 2021-09-15 DIAGNOSIS — I9589 Other hypotension: Secondary | ICD-10-CM | POA: Diagnosis present

## 2021-09-15 DIAGNOSIS — W109XXA Fall (on) (from) unspecified stairs and steps, initial encounter: Secondary | ICD-10-CM | POA: Diagnosis present

## 2021-09-15 DIAGNOSIS — Z515 Encounter for palliative care: Secondary | ICD-10-CM

## 2021-09-15 DIAGNOSIS — IMO0001 Reserved for inherently not codable concepts without codable children: Secondary | ICD-10-CM

## 2021-09-15 DIAGNOSIS — Z833 Family history of diabetes mellitus: Secondary | ICD-10-CM

## 2021-09-15 DIAGNOSIS — Z885 Allergy status to narcotic agent status: Secondary | ICD-10-CM

## 2021-09-15 DIAGNOSIS — C787 Secondary malignant neoplasm of liver and intrahepatic bile duct: Secondary | ICD-10-CM | POA: Diagnosis present

## 2021-09-15 DIAGNOSIS — R54 Age-related physical debility: Secondary | ICD-10-CM | POA: Diagnosis present

## 2021-09-15 DIAGNOSIS — R601 Generalized edema: Secondary | ICD-10-CM

## 2021-09-15 DIAGNOSIS — C569 Malignant neoplasm of unspecified ovary: Secondary | ICD-10-CM | POA: Diagnosis present

## 2021-09-15 DIAGNOSIS — R52 Pain, unspecified: Secondary | ICD-10-CM | POA: Diagnosis present

## 2021-09-15 DIAGNOSIS — I10 Essential (primary) hypertension: Secondary | ICD-10-CM | POA: Diagnosis present

## 2021-09-15 DIAGNOSIS — N390 Urinary tract infection, site not specified: Secondary | ICD-10-CM | POA: Diagnosis present

## 2021-09-15 DIAGNOSIS — Z531 Procedure and treatment not carried out because of patient's decision for reasons of belief and group pressure: Secondary | ICD-10-CM | POA: Diagnosis present

## 2021-09-15 DIAGNOSIS — C786 Secondary malignant neoplasm of retroperitoneum and peritoneum: Secondary | ICD-10-CM | POA: Diagnosis present

## 2021-09-15 DIAGNOSIS — M79606 Pain in leg, unspecified: Secondary | ICD-10-CM | POA: Diagnosis present

## 2021-09-15 DIAGNOSIS — Y92009 Unspecified place in unspecified non-institutional (private) residence as the place of occurrence of the external cause: Secondary | ICD-10-CM

## 2021-09-15 DIAGNOSIS — Z79899 Other long term (current) drug therapy: Secondary | ICD-10-CM

## 2021-09-15 DIAGNOSIS — D869 Sarcoidosis, unspecified: Secondary | ICD-10-CM | POA: Diagnosis present

## 2021-09-15 DIAGNOSIS — D649 Anemia, unspecified: Secondary | ICD-10-CM

## 2021-09-15 DIAGNOSIS — L8990 Pressure ulcer of unspecified site, unspecified stage: Secondary | ICD-10-CM | POA: Diagnosis present

## 2021-09-15 DIAGNOSIS — D72829 Elevated white blood cell count, unspecified: Secondary | ICD-10-CM | POA: Diagnosis present

## 2021-09-15 DIAGNOSIS — E785 Hyperlipidemia, unspecified: Secondary | ICD-10-CM

## 2021-09-15 DIAGNOSIS — R7303 Prediabetes: Secondary | ICD-10-CM | POA: Diagnosis present

## 2021-09-15 HISTORY — DX: Acute kidney failure, unspecified: N17.9

## 2021-09-15 HISTORY — DX: Anemia, unspecified: D64.9

## 2021-09-15 HISTORY — DX: Malignant neoplasm of unspecified ovary: C56.9

## 2021-09-15 LAB — CBC WITH DIFFERENTIAL/PLATELET
Abs Immature Granulocytes: 0.11 10*3/uL — ABNORMAL HIGH (ref 0.00–0.07)
Basophils Absolute: 0 10*3/uL (ref 0.0–0.1)
Basophils Relative: 0 %
Eosinophils Absolute: 0 10*3/uL (ref 0.0–0.5)
Eosinophils Relative: 0 %
HCT: 31.7 % — ABNORMAL LOW (ref 36.0–46.0)
Hemoglobin: 10.6 g/dL — ABNORMAL LOW (ref 12.0–15.0)
Immature Granulocytes: 1 %
Lymphocytes Relative: 10 %
Lymphs Abs: 1.3 10*3/uL (ref 0.7–4.0)
MCH: 27.2 pg (ref 26.0–34.0)
MCHC: 33.4 g/dL (ref 30.0–36.0)
MCV: 81.3 fL (ref 80.0–100.0)
Monocytes Absolute: 0.5 10*3/uL (ref 0.1–1.0)
Monocytes Relative: 4 %
Neutro Abs: 11.7 10*3/uL — ABNORMAL HIGH (ref 1.7–7.7)
Neutrophils Relative %: 85 %
Platelets: 320 10*3/uL (ref 150–400)
RBC: 3.9 MIL/uL (ref 3.87–5.11)
RDW: 16.9 % — ABNORMAL HIGH (ref 11.5–15.5)
WBC: 13.7 10*3/uL — ABNORMAL HIGH (ref 4.0–10.5)
nRBC: 0 % (ref 0.0–0.2)

## 2021-09-15 LAB — URINALYSIS, ROUTINE W REFLEX MICROSCOPIC
Glucose, UA: NEGATIVE mg/dL
Hgb urine dipstick: NEGATIVE
Ketones, ur: NEGATIVE mg/dL
Nitrite: NEGATIVE
Protein, ur: 100 mg/dL — AB
Specific Gravity, Urine: 1.031 — ABNORMAL HIGH (ref 1.005–1.030)
pH: 5 (ref 5.0–8.0)

## 2021-09-15 LAB — COMPREHENSIVE METABOLIC PANEL
ALT: 28 U/L (ref 0–44)
AST: 39 U/L (ref 15–41)
Albumin: 1.7 g/dL — ABNORMAL LOW (ref 3.5–5.0)
Alkaline Phosphatase: 134 U/L — ABNORMAL HIGH (ref 38–126)
Anion gap: 7 (ref 5–15)
BUN: 30 mg/dL — ABNORMAL HIGH (ref 8–23)
CO2: 18 mmol/L — ABNORMAL LOW (ref 22–32)
Calcium: 7.6 mg/dL — ABNORMAL LOW (ref 8.9–10.3)
Chloride: 112 mmol/L — ABNORMAL HIGH (ref 98–111)
Creatinine, Ser: 1.43 mg/dL — ABNORMAL HIGH (ref 0.44–1.00)
GFR, Estimated: 40 mL/min — ABNORMAL LOW (ref >60)
Glucose, Bld: 64 mg/dL — ABNORMAL LOW (ref 70–99)
Potassium: 3.5 mmol/L (ref 3.5–5.1)
Sodium: 137 mmol/L (ref 135–145)
Total Bilirubin: 1.9 mg/dL — ABNORMAL HIGH (ref 0.3–1.2)
Total Protein: 3.9 g/dL — ABNORMAL LOW (ref 6.5–8.1)

## 2021-09-15 LAB — CBC
HCT: 32.7 % — ABNORMAL LOW (ref 36.0–46.0)
Hemoglobin: 10.8 g/dL — ABNORMAL LOW (ref 12.0–15.0)
MCH: 26.9 pg (ref 26.0–34.0)
MCHC: 33 g/dL (ref 30.0–36.0)
MCV: 81.5 fL (ref 80.0–100.0)
Platelets: 311 10*3/uL (ref 150–400)
RBC: 4.01 MIL/uL (ref 3.87–5.11)
RDW: 16.9 % — ABNORMAL HIGH (ref 11.5–15.5)
WBC: 18.7 10*3/uL — ABNORMAL HIGH (ref 4.0–10.5)
nRBC: 0 % (ref 0.0–0.2)

## 2021-09-15 LAB — GLUCOSE, CAPILLARY: Glucose-Capillary: 85 mg/dL (ref 70–99)

## 2021-09-15 LAB — LACTIC ACID, PLASMA
Lactic Acid, Venous: 4.6 mmol/L (ref 0.5–1.9)
Lactic Acid, Venous: 3.9 mmol/L (ref 0.5–1.9)
Lactic Acid, Venous: 4.9 mmol/L (ref 0.5–1.9)
Lactic Acid, Venous: 5.3 mmol/L (ref 0.5–1.9)
Lactic Acid, Venous: 5.1 mmol/L (ref 0.5–1.9)

## 2021-09-15 LAB — PROCALCITONIN: Procalcitonin: 36.17 ng/mL

## 2021-09-15 LAB — CREATININE, SERUM
Creatinine, Ser: 1.64 mg/dL — ABNORMAL HIGH (ref 0.44–1.00)
GFR, Estimated: 34 mL/min — ABNORMAL LOW (ref >60)

## 2021-09-15 LAB — MRSA NEXT GEN BY PCR, NASAL: MRSA by PCR Next Gen: DETECTED — AB

## 2021-09-15 MED ORDER — SODIUM CHLORIDE 0.9 % IV SOLN: Freq: Once | INTRAVENOUS | Status: AC

## 2021-09-15 MED ORDER — ONDANSETRON HCL 4 MG/2ML IJ SOLN
4.0000 mg | Freq: Four times a day (QID) | INTRAMUSCULAR | Status: DC | PRN
  Administered 2021-09-16: 4 mg via INTRAVENOUS
  Filled 2021-09-15: qty 2

## 2021-09-15 MED ORDER — FENTANYL CITRATE PF 50 MCG/ML IJ SOSY
25.0000 ug | PREFILLED_SYRINGE | Freq: Once | INTRAMUSCULAR | Status: AC
  Administered 2021-09-15: 25 ug via INTRAVENOUS
  Filled 2021-09-15: qty 1

## 2021-09-15 MED ORDER — VANCOMYCIN HCL IN DEXTROSE 1-5 GM/200ML-% IV SOLN: 1000.0000 mg | Freq: Once | INTRAVENOUS | Status: DC

## 2021-09-15 MED ORDER — CHLORHEXIDINE GLUCONATE CLOTH 2 % EX PADS
6.0000 | MEDICATED_PAD | Freq: Every day | CUTANEOUS | Status: DC
  Administered 2021-09-16 – 2021-09-18 (×3): 6 via TOPICAL

## 2021-09-15 MED ORDER — TRAMADOL HCL 50 MG PO TABS: 50.0000 mg | ORAL_TABLET | Freq: Three times a day (TID) | ORAL | Status: DC | PRN

## 2021-09-15 MED ORDER — HYDROMORPHONE HCL 1 MG/ML IJ SOLN
0.7500 mg | INTRAMUSCULAR | Status: DC | PRN
  Administered 2021-09-15 – 2021-09-19 (×2): 0.75 mg via INTRAVENOUS
  Filled 2021-09-15 (×2): qty 1

## 2021-09-15 MED ORDER — VANCOMYCIN HCL 1500 MG/300ML IV SOLN: 1500.0000 mg | Freq: Once | INTRAVENOUS | Status: DC

## 2021-09-15 MED ORDER — VANCOMYCIN HCL 500 MG/100ML IV SOLN
500.0000 mg | Freq: Once | INTRAVENOUS | Status: AC
  Administered 2021-09-15: 500 mg via INTRAVENOUS

## 2021-09-15 MED ORDER — LACTATED RINGERS IV SOLN: INTRAVENOUS | Status: DC

## 2021-09-15 MED ORDER — DEXTROSE IN LACTATED RINGERS 5 % IV SOLN: INTRAVENOUS | Status: DC

## 2021-09-15 MED ORDER — METRONIDAZOLE 500 MG/100ML IV SOLN
500.0000 mg | Freq: Two times a day (BID) | INTRAVENOUS | Status: DC
  Administered 2021-09-15 – 2021-09-18 (×6): 500 mg via INTRAVENOUS
  Filled 2021-09-15 (×6): qty 100

## 2021-09-15 MED ORDER — OXYCODONE HCL 5 MG PO TABS: 5.0000 mg | ORAL_TABLET | ORAL | Status: DC | PRN

## 2021-09-15 MED ORDER — SODIUM CHLORIDE 0.9 % IV SOLN
2.0000 g | Freq: Once | INTRAVENOUS | Status: AC
Start: 2021-09-15 — End: 2021-09-15
  Administered 2021-09-15: 2 g via INTRAVENOUS
  Filled 2021-09-15: qty 12.5

## 2021-09-15 MED ORDER — ONDANSETRON HCL 4 MG PO TABS: 4.0000 mg | ORAL_TABLET | Freq: Four times a day (QID) | ORAL | Status: DC | PRN

## 2021-09-15 MED ORDER — VANCOMYCIN HCL 750 MG/150ML IV SOLN: 750.0000 mg | INTRAVENOUS | Status: DC

## 2021-09-15 MED ORDER — DOCUSATE SODIUM 100 MG PO CAPS
100.0000 mg | ORAL_CAPSULE | Freq: Two times a day (BID) | ORAL | Status: DC
  Administered 2021-09-15 – 2021-09-16 (×3): 100 mg via ORAL
  Filled 2021-09-15 (×4): qty 1

## 2021-09-15 MED ORDER — CEFEPIME HCL 2 G IV SOLR
2.0000 g | Freq: Two times a day (BID) | INTRAVENOUS | Status: DC
  Administered 2021-09-16: 2 g via INTRAVENOUS
  Filled 2021-09-15: qty 12.5

## 2021-09-15 MED ORDER — LACTATED RINGERS IV BOLUS
1000.0000 mL | Freq: Once | INTRAVENOUS | Status: AC
  Administered 2021-09-15: 1000 mL via INTRAVENOUS

## 2021-09-15 MED ORDER — FENTANYL CITRATE PF 50 MCG/ML IJ SOSY
50.0000 ug | PREFILLED_SYRINGE | Freq: Once | INTRAMUSCULAR | Status: AC
  Administered 2021-09-15: 50 ug via INTRAVENOUS
  Filled 2021-09-15: qty 1

## 2021-09-15 MED ORDER — IOHEXOL 350 MG/ML SOLN
100.0000 mL | Freq: Once | INTRAVENOUS | Status: AC | PRN
  Administered 2021-09-15: 100 mL via INTRAVENOUS

## 2021-09-15 MED ORDER — ALBUTEROL SULFATE (2.5 MG/3ML) 0.083% IN NEBU: 2.5000 mg | INHALATION_SOLUTION | RESPIRATORY_TRACT | Status: DC | PRN

## 2021-09-15 MED ORDER — SODIUM CHLORIDE 0.9 % IV BOLUS
1000.0000 mL | Freq: Once | INTRAVENOUS | Status: AC
Start: 2021-09-15 — End: 2021-09-15
  Administered 2021-09-15: 1000 mL via INTRAVENOUS

## 2021-09-15 MED ORDER — ENOXAPARIN SODIUM 40 MG/0.4ML IJ SOSY
40.0000 mg | PREFILLED_SYRINGE | INTRAMUSCULAR | Status: DC
  Administered 2021-09-15: 40 mg via SUBCUTANEOUS
  Filled 2021-09-15: qty 0.4

## 2021-09-15 MED ORDER — NOREPINEPHRINE 4 MG/250ML-% IV SOLN
0.0000 ug/min | INTRAVENOUS | Status: DC
  Administered 2021-09-15: 13 ug/min via INTRAVENOUS
  Administered 2021-09-15: 2 ug/min via INTRAVENOUS
  Administered 2021-09-15 – 2021-09-16 (×2): 11 ug/min via INTRAVENOUS
  Administered 2021-09-16: 10 ug/min via INTRAVENOUS
  Administered 2021-09-16: 7 ug/min via INTRAVENOUS
  Filled 2021-09-15 (×6): qty 250

## 2021-09-15 MED ORDER — VANCOMYCIN HCL IN DEXTROSE 1-5 GM/200ML-% IV SOLN
1000.0000 mg | Freq: Once | INTRAVENOUS | Status: AC
  Administered 2021-09-15: 1000 mg via INTRAVENOUS
  Filled 2021-09-15: qty 200

## 2021-09-15 NOTE — Assessment & Plan Note (Signed)
stable °

## 2021-09-15 NOTE — Assessment & Plan Note (Signed)
--   Pt will not accept blood products under any circumstances

## 2021-09-15 NOTE — Assessment & Plan Note (Signed)
--   longstanding problem but worse in last weeks, supportive measures ordered, try TED hoses if tolerated, elevate legs, no evidence of blood clot from the CT vascular imaging -- venous doppler imaging to ruled out deep vein thrombosis -- IV albumin and lasix ordered

## 2021-09-15 NOTE — ED Notes (Signed)
Echo at bedside. Patient alert and speaking to this nurse asking to sit on side of bed once exam complete. Verbalizes understanding of why she cannot.

## 2021-09-15 NOTE — Assessment & Plan Note (Signed)
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

## 2021-09-15 NOTE — Assessment & Plan Note (Addendum)
--   stable, no interventions needed

## 2021-09-15 NOTE — Assessment & Plan Note (Signed)
--   discussed with patient and daughter at bedside and after discussion patient would like to treat acute illness as we are able but expressed desire for natural death, no resuscitation, no intubation, ok to continue pressors for now, can readdress later if not able to wean off pressors, continue DNR order in hospital

## 2021-09-15 NOTE — H&P (Signed)
History and Physical  Fajardo FUX:[id number] DOB: 1950-11-30 DOA: 09/15/2021  PCP: Lindell Spar, MD  Patient coming from: Home  Level of care: ICU  I have personally briefly reviewed patient's old medical records in [place]  Chief Complaint: left leg pain  HPI: Emma Russell is a 71 year old female with history of sarcoidosis, COPD, GERD, chronic lymphedema and diagnosed approximately 1 year ago with stage IV metastatic ovarian cancer.  She was seen by Dr. Denman George once in July 2022 and noted not to be a surgical candidate for tumor debulking.  Patient elected not to undergo systemic treatment at that time.  She was sent home with palliative care.  She has been having twice monthly telephone visits with palliative care.  She is also establish primary care with Dr. Posey Pronto in [place].  Unfortunately she has continued to lose weight over the last year and her appetite has continued to worsen.  She can barely take 1 bite without feeling full.  She only takes sips of liquids.  Unfortunately she had a fall at home and was seen in the emergency department on 09/12/2021 and found to have a left clavicle fracture.  She also injured her left leg.  She continues to have pain and swelling in the left leg.  She denies having abdominal pain.  She has not required pain medication at home over the past year as the ovarian cancer has progressed.  She presented to the emergency department with weakness dizziness that she has been having for the last several days.  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  Patient denies fever and chills.  She has had occasional cough and chest congestion.  She denies dysuria.  Patient was noted to be significantly hypotensive when she arrived in the emergency department with a BP of 66/46.  She was given fluid hydration in the IV however her blood pressures did not improve.  A central line was placed and she was started on  pressor support.  Her lactic acid was markedly elevated at 4.6 and her WBC was elevated at 13.7.  Her creatinine was 1.43 with a BUN of 30.  She was hypoglycemic with a blood sugar of 64.  Her bilirubin was elevated at 1.9.  Her hemoglobin was 10.6.  She was started on treatment for septic shock and admission was requested for further management.  Review of Systems: Review of Systems  Constitutional:  Positive for diaphoresis, malaise/fatigue and weight loss.  HENT: Negative.    Eyes: Negative.   Respiratory: Negative.    Cardiovascular: Negative.   Gastrointestinal: Negative.   Genitourinary: Negative.   Musculoskeletal:  Positive for falls, joint pain, myalgias and neck pain.  Skin:  Positive for itching and rash.  Neurological: Negative.   Endo/Heme/Allergies:  Bruises/bleeds easily.  Psychiatric/Behavioral: Negative.       Past Medical History:  Diagnosis Date   AKI (acute kidney injury) ([place]) 09/15/2021   Anxiety    Cataract    COPD (chronic obstructive pulmonary disease) (HCC)    GERD (gastroesophageal reflux disease)    Headache    Hyperlipidemia    Hypertension    Incontinence of urine    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 09/15/2021   Pre-diabetes    Sarcoidosis    Thyromegaly 07/19/2020    Past Surgical History:  Procedure Laterality Date   ORIF WRIST FRACTURE Left 06/30/2017   Procedure: OPEN REDUCTION INTERNAL FIXATION (  ORIF) WRIST FRACTURE;  Surgeon: Hiram Gash, MD;  Location: [facility];  Service: Orthopedics;  Laterality: Left;   TUBAL LIGATION       reports that she has never smoked. She has been exposed to tobacco smoke. She has never used smokeless tobacco. She reports that she does not currently use alcohol. She reports that she does not use drugs.  Allergies  Allergen Reactions   Codeine Nausea And Vomiting    Family History  Problem Relation Age of Onset   COPD Mother    Diabetes Mother    Hepatitis C Mother    Alcohol abuse Father     Lung cancer Father    Cancer Maternal Aunt    Uterine cancer Maternal Grandmother    Drug abuse Son 79       overdose   Drug abuse Son    Hepatitis C Son    Breast cancer Neg Hx    Colon cancer Neg Hx    Pancreatic cancer Neg Hx    Prostate cancer Neg Hx    Ovarian cancer Neg Hx     Prior to Admission medications   Medication Sig Start Date End Date Taking? Authorizing Provider  Ascorbic Acid (VITAMIN C) 1000 MG tablet Take 2,000 mg by mouth daily.   Yes [provider]  B Complex Vitamins (VITAMIN B COMPLEX) TABS Take 1 tablet by mouth daily.   Yes [provider]  cholecalciferol (VITAMIN D) 25 MCG (1000 UNIT) tablet Take 1,000 Units by mouth daily.   Yes [provider]  Coenzyme Q10 (CO Q 10) 100 MG CAPS Take 1 capsule by mouth daily.   Yes [provider]  Cyanocobalamin (VITAMIN B 12 PO) Take 1 tablet by mouth daily.   Yes [provider]  HORSE CHESTNUT PO Take 1 tablet by mouth daily.   Yes [provider]  Magnesium Gluconate POWD Take 400 mg by mouth daily.   Yes [provider]  Milk Thistle 150 MG CAPS Take 1 capsule by mouth in the morning, at noon, and at bedtime.   Yes [provider]  Turmeric (QC TUMERIC COMPLEX PO) Take 1,350 mg by mouth daily as needed.   Yes [provider]  vitamin k 100 MCG tablet Take 100 mcg by mouth daily.   Yes [provider]  Acetylcysteine (NAC PO) Take by mouth.    [provider]    Physical Exam: Vitals:   09/15/21 1620 09/15/21 1630 09/15/21 1640 09/15/21 1721  BP: 111/73 (!) 91/58 96/67   Pulse: 92 92 92 92  Resp: 17 (!) '21 19 18  ' Temp:    (!) 97.4 F (36.3 C)  TempSrc:    Oral  SpO2: 100% 100% 100% 100%  Weight:    68.8 kg  Height:    '5\' 3"'  (1.6 m)    Constitutional: NAD, calm, comfortable Eyes: PERRL, lids and conjunctivae normal ENMT: Mucous membranes are dry. Posterior pharynx clear of any exudate or lesions.Normal  dentition.  Neck: normal, supple, no masses, no thyromegaly Respiratory: clear to auscultation bilaterally, no wheezing, no crackles. Normal respiratory effort. No accessory muscle use.  Cardiovascular: normal s1, s2 sounds, no murmurs / rubs / gallops. No extremity edema. 2+ pedal pulses. No carotid bruits.  Abdomen: no tenderness, no masses palpated. No hepatosplenomegaly. Bowel sounds positive.  Musculoskeletal: 2+ pitting edema BLEs, no clubbing / cyanosis. No joint deformity upper and lower extremities. Good ROM, no contractures. Normal muscle tone.  Skin:  no rashes, lesions, ulcers. No induration Neurologic: CN 2-12 grossly intact. Sensation intact, DTR normal. Strength 5/5 in all 4.  Psychiatric: Normal judgment and insight. Alert and oriented x 3. Normal mood.   Labs on Admission: I have personally reviewed following labs and imaging studies  CBC: Recent Labs  Lab 09/15/21 0914  WBC 13.7*  NEUTROABS 11.7*  HGB 10.6*  HCT 31.7*  MCV 81.3  PLT 921   Basic Metabolic Panel: Recent Labs  Lab 09/15/21 0914  NA 137  K 3.5  CL 112*  CO2 18*  GLUCOSE 64*  BUN 30*  CREATININE 1.43*  CALCIUM 7.6*   GFR: Estimated Creatinine Clearance: 34.6 mL/min (A) (by C-G formula based on SCr of 1.43 mg/dL (H)). Liver Function Tests: Recent Labs  Lab 09/15/21 0914  AST 39  ALT 28  ALKPHOS 134*  BILITOT 1.9*  PROT 3.9*  ALBUMIN 1.7*   No results for input(s): "LIPASE", "AMYLASE" in the last 168 hours. No results for input(s): "AMMONIA" in the last 168 hours. Coagulation Profile: No results for input(s): "INR", "PROTIME" in the last 168 hours. Cardiac Enzymes: No results for input(s): "CKTOTAL", "CKMB", "CKMBINDEX", "TROPONINI" in the last 168 hours. BNP (last 3 results) No results for input(s): "PROBNP" in the last 8760 hours. HbA1C: No results for input(s): "HGBA1C" in the last 72 hours. CBG: No results for input(s): "GLUCAP" in the last 168 hours. Lipid Profile: No  results for input(s): "CHOL", "HDL", "LDLCALC", "TRIG", "CHOLHDL", "LDLDIRECT" in the last 72 hours. Thyroid Function Tests: No results for input(s): "TSH", "T4TOTAL", "FREET4", "T3FREE", "THYROIDAB" in the last 72 hours. Anemia Panel: No results for input(s): "VITAMINB12", "FOLATE", "FERRITIN", "TIBC", "IRON", "RETICCTPCT" in the last 72 hours. Urine analysis:    Component Value Date/Time   COLORURINE AMBER (A) 09/15/2021 0914   APPEARANCEUR CLOUDY (A) 09/15/2021 0914   LABSPEC 1.031 (H) 09/15/2021 0914   PHURINE 5.0 09/15/2021 0914   GLUCOSEU NEGATIVE 09/15/2021 0914   HGBUR NEGATIVE 09/15/2021 0914   BILIRUBINUR SMALL (A) 09/15/2021 0914   KETONESUR NEGATIVE 09/15/2021 0914   PROTEINUR 100 (A) 09/15/2021 0914   NITRITE NEGATIVE 09/15/2021 0914   LEUKOCYTESUR TRACE (A) 09/15/2021 0914    Radiological Exams on Admission: CT Angio Aortobifemoral W and/or Wo Contrast  Result Date: 09/15/2021 CLINICAL DATA:  Left lower leg pain and swelling for 3 days. Low blood pressure. Metastatic carcinoma. EXAM: CT CHEST, ABDOMEN, AND PELVIS WITH CONTRAST CT ANGIOGRAPHY OF ABDOMINAL AORTA WITH ILIOFEMORAL RUNOFF TECHNIQUE: Multidetector CT imaging of the chest, abdomen and pelvis was performed following the standard protocol during bolus administration of intravenous contrast. Multidetector CT imaging of the abdomen, pelvis and lower extremities was performed using the standard protocol during bolus administration of intravenous contrast. Multiplanar CT image reconstructions and MIPs were obtained to evaluate the vascular anatomy. RADIATION DOSE REDUCTION: This exam was performed according to the departmental dose-optimization program which includes automated exposure control, adjustment of the mA and/or kV according to patient size and/or use of iterative reconstruction technique. CONTRAST:  156m OMNIPAQUE IOHEXOL 350 MG/ML SOLN COMPARISON:  CT abdomen pelvis, 09/13/2020. FINDINGS: CT CHEST FINDINGS  Cardiovascular: Heart normal in size. No pericardial effusion. Great vessels are normal in caliber. Mild aortic atherosclerosis. Mediastinum/Nodes: No neck base, mediastinal or hilar masses or enlarged lymph nodes. Several small calcified mediastinal and hilar lymph nodes. Trachea and esophagus are unremarkable. Lungs/Pleura: Small to moderate right and small left pleural effusions. Mild bronchiectasis and irregular interstitial thickening, most evident in the upper lobes. Areas of  peripheral interstitial thickening have a reticulonodular appearance. Small discrete nodules, left lower lobe, image 79, series 3, 4 mm, right lower lobe, image 67, series 3, 4 mm and right lower lobe, image 57, series 3, 3 mm. Minor dependent atelectasis in the posterior right lower lobe. No pneumothorax. Musculoskeletal: No fracture or acute finding. No osteoblastic or osteolytic lesions. CT ABDOMEN PELVIS FINDINGS Hepatobiliary: Numerous hyperattenuating liver masses in setting of hepatic steatosis and hepatomegaly. Findings have significantly progressed when compared to the exam from 09/13/2020. Small dependent gallstone. No acute cholecystitis. No bile duct dilation. Pancreas: No pancreatic mass or inflammation. Spleen: Normal in size without focal abnormality. Adrenals/Urinary Tract: No adrenal masses. Symmetric renal enhancement and excretion. 8 mm posterior lower pole left renal mass consistent with a cyst. No other renal masses, no stones and no hydronephrosis. Normal ureters. Bladder minimally distended. There are densities consistent with calcifications along the margin of the posterior bladder wall. No bladder mass. Stomach/Bowel: Stomach unremarkable. Small bowel and colon are normal in caliber. No wall thickening. No convincing inflammation. Vascular/Lymphatic: Aortic atherosclerosis. No aneurysm. No enlarged lymph node. Reproductive: Heterogeneous left pelvic/adnexal masses, largest 6.7 x 4.9 cm transversely. A smaller mass  lies anterior and inferior to this, with peripheral calcification, measuring 5.2 x 3.5 cm. Masses are similar to the prior CT. Other: Small amount of ascites. Multiple small nodules along the greater omentum and mesentery consistent with metastatic deposits. Diffuse subcutaneous soft tissue edema. Musculoskeletal: No fracture or acute finding. No osteoblastic or osteolytic lesions. CTA ABDOMEN WITH ILEAL FEMORAL RUNOFF VASCULAR Aorta: Normal in caliber.  Minor atherosclerosis.  No stenosis. Celiac: Included on the abdomen and pelvis CT.  Widely patent. SMA: Included on the abdomen and pelvis CT, widely patent. Renals: Included on the abdomen and pelvis CT. Right widely patent. Left with plaque at the origin, estimated 50% narrowing. IMA: Small but patent. RIGHT Lower Extremity Inflow: Common, internal and external iliac arteries are patent without evidence of aneurysm, dissection, vasculitis or significant stenosis. Outflow: Common, superficial and profunda femoral arteries and the popliteal artery are patent without evidence of aneurysm, dissection, vasculitis or significant stenosis. Runoff: Patent three vessel runoff to the ankle. LEFT Lower Extremity Inflow: Common, internal and external iliac arteries are patent without evidence of aneurysm, dissection, vasculitis or significant stenosis. Outflow: Common, superficial and profunda femoral arteries and the popliteal artery are patent without evidence of aneurysm, dissection, vasculitis or significant stenosis. Runoff: Patent three vessel runoff to the ankle. Veins: No obvious venous abnormality within the limitations of this arterial phase study. Abdomen and pelvic veins noted on the current CT are patent. Review of the MIP images confirms the above findings. IMPRESSION: CT CHEST, ABDOMEN AND PELVIS 1. Lung findings with irregular interstitial thickening, there is with a reticulonodular appearance, as well as bronchiectasis and scarring, with an upper lobe  predominance, may all be chronic. There may be a component of interstitial edema. 2. Small moderate right and small left pleural effusions. 3. Small pulmonary nodules, could reflect metastatic disease. 4. Liver diffusely enlarged with numerous metastatic lesions superimposed on extensive hepatic steatosis. Liver appearance has markedly progressed when compared to the CT from 09/13/2020. 5. Pelvic masses and peritoneal and omental nodules, as well as ascites, consistent with primary ovarian malignancy and peritoneal carcinomatosis. Ascites is new and mental disease has worsened compared to the prior CT. CTA ABDOMEN AND ILIOFEMORAL RUNOFF VASCULAR 1. No aortic dissection or aneurysm. 2. No significant stenosis of the aorta or its branch vessels. 3. Vessels patent  throughout both lower extremities. No vascular findings to account for left lower leg pain and swelling. However, lower extremity veins are not well appreciated on the arterial phase exam. NON-VASCULAR 1. Subcutaneous soft tissue edema extending from the abdomen and pelvis through the lower extremities. 2. No soft tissue mass. No defined fluid collection to suggest an abscess. Electronically Signed   By: Lajean Manes M.D.   On: 09/15/2021 12:59   CT CHEST ABDOMEN PELVIS W CONTRAST  Result Date: 09/15/2021 CLINICAL DATA:  Left lower leg pain and swelling for 3 days. Low blood pressure. Metastatic carcinoma. EXAM: CT CHEST, ABDOMEN, AND PELVIS WITH CONTRAST CT ANGIOGRAPHY OF ABDOMINAL AORTA WITH ILIOFEMORAL RUNOFF TECHNIQUE: Multidetector CT imaging of the chest, abdomen and pelvis was performed following the standard protocol during bolus administration of intravenous contrast. Multidetector CT imaging of the abdomen, pelvis and lower extremities was performed using the standard protocol during bolus administration of intravenous contrast. Multiplanar CT image reconstructions and MIPs were obtained to evaluate the vascular anatomy. RADIATION DOSE  REDUCTION: This exam was performed according to the departmental dose-optimization program which includes automated exposure control, adjustment of the mA and/or kV according to patient size and/or use of iterative reconstruction technique. CONTRAST:  140m OMNIPAQUE IOHEXOL 350 MG/ML SOLN COMPARISON:  CT abdomen pelvis, 09/13/2020. FINDINGS: CT CHEST FINDINGS Cardiovascular: Heart normal in size. No pericardial effusion. Great vessels are normal in caliber. Mild aortic atherosclerosis. Mediastinum/Nodes: No neck base, mediastinal or hilar masses or enlarged lymph nodes. Several small calcified mediastinal and hilar lymph nodes. Trachea and esophagus are unremarkable. Lungs/Pleura: Small to moderate right and small left pleural effusions. Mild bronchiectasis and irregular interstitial thickening, most evident in the upper lobes. Areas of peripheral interstitial thickening have a reticulonodular appearance. Small discrete nodules, left lower lobe, image 79, series 3, 4 mm, right lower lobe, image 67, series 3, 4 mm and right lower lobe, image 57, series 3, 3 mm. Minor dependent atelectasis in the posterior right lower lobe. No pneumothorax. Musculoskeletal: No fracture or acute finding. No osteoblastic or osteolytic lesions. CT ABDOMEN PELVIS FINDINGS Hepatobiliary: Numerous hyperattenuating liver masses in setting of hepatic steatosis and hepatomegaly. Findings have significantly progressed when compared to the exam from 09/13/2020. Small dependent gallstone. No acute cholecystitis. No bile duct dilation. Pancreas: No pancreatic mass or inflammation. Spleen: Normal in size without focal abnormality. Adrenals/Urinary Tract: No adrenal masses. Symmetric renal enhancement and excretion. 8 mm posterior lower pole left renal mass consistent with a cyst. No other renal masses, no stones and no hydronephrosis. Normal ureters. Bladder minimally distended. There are densities consistent with calcifications along the margin  of the posterior bladder wall. No bladder mass. Stomach/Bowel: Stomach unremarkable. Small bowel and colon are normal in caliber. No wall thickening. No convincing inflammation. Vascular/Lymphatic: Aortic atherosclerosis. No aneurysm. No enlarged lymph node. Reproductive: Heterogeneous left pelvic/adnexal masses, largest 6.7 x 4.9 cm transversely. A smaller mass lies anterior and inferior to this, with peripheral calcification, measuring 5.2 x 3.5 cm. Masses are similar to the prior CT. Other: Small amount of ascites. Multiple small nodules along the greater omentum and mesentery consistent with metastatic deposits. Diffuse subcutaneous soft tissue edema. Musculoskeletal: No fracture or acute finding. No osteoblastic or osteolytic lesions. CTA ABDOMEN WITH ILEAL FEMORAL RUNOFF VASCULAR Aorta: Normal in caliber.  Minor atherosclerosis.  No stenosis. Celiac: Included on the abdomen and pelvis CT.  Widely patent. SMA: Included on the abdomen and pelvis CT, widely patent. Renals: Included on the abdomen and pelvis CT. Right widely  patent. Left with plaque at the origin, estimated 50% narrowing. IMA: Small but patent. RIGHT Lower Extremity Inflow: Common, internal and external iliac arteries are patent without evidence of aneurysm, dissection, vasculitis or significant stenosis. Outflow: Common, superficial and profunda femoral arteries and the popliteal artery are patent without evidence of aneurysm, dissection, vasculitis or significant stenosis. Runoff: Patent three vessel runoff to the ankle. LEFT Lower Extremity Inflow: Common, internal and external iliac arteries are patent without evidence of aneurysm, dissection, vasculitis or significant stenosis. Outflow: Common, superficial and profunda femoral arteries and the popliteal artery are patent without evidence of aneurysm, dissection, vasculitis or significant stenosis. Runoff: Patent three vessel runoff to the ankle. Veins: No obvious venous abnormality within the  limitations of this arterial phase study. Abdomen and pelvic veins noted on the current CT are patent. Review of the MIP images confirms the above findings. IMPRESSION: CT CHEST, ABDOMEN AND PELVIS 1. Lung findings with irregular interstitial thickening, there is with a reticulonodular appearance, as well as bronchiectasis and scarring, with an upper lobe predominance, may all be chronic. There may be a component of interstitial edema. 2. Small moderate right and small left pleural effusions. 3. Small pulmonary nodules, could reflect metastatic disease. 4. Liver diffusely enlarged with numerous metastatic lesions superimposed on extensive hepatic steatosis. Liver appearance has markedly progressed when compared to the CT from 09/13/2020. 5. Pelvic masses and peritoneal and omental nodules, as well as ascites, consistent with primary ovarian malignancy and peritoneal carcinomatosis. Ascites is new and mental disease has worsened compared to the prior CT. CTA ABDOMEN AND ILIOFEMORAL RUNOFF VASCULAR 1. No aortic dissection or aneurysm. 2. No significant stenosis of the aorta or its branch vessels. 3. Vessels patent throughout both lower extremities. No vascular findings to account for left lower leg pain and swelling. However, lower extremity veins are not well appreciated on the arterial phase exam. NON-VASCULAR 1. Subcutaneous soft tissue edema extending from the abdomen and pelvis through the lower extremities. 2. No soft tissue mass. No defined fluid collection to suggest an abscess. Electronically Signed   By: Lajean Manes M.D.   On: 09/15/2021 12:59   DG Chest Portable 1 View  Result Date: 09/15/2021 CLINICAL DATA:  Hypotension.  History of sarcoidosis.  Ex-smoker. EXAM: PORTABLE CHEST 1 VIEW COMPARISON:  05/03/2016 FINDINGS: Numerous leads and wires project over the chest. Midline trachea. Normal heart size. No pleural effusion or pneumothorax. Left-greater-than-right, upper lobe predominant marked  interstitial thickening with peribronchovascular nodularity. Left upper lobe volume loss with hilar retraction. Relative sparing of the right lung base. Findings are significantly progressive compared to 2018. IMPRESSION: Upper lung predominant peribronchovascular interstitial thickening and nodularity. Most likely related to the clinical history of sarcoidosis. Superimposed infection, including atypical etiologies, cannot be excluded given interval change since 2018. Electronically Signed   By: Abigail Miyamoto M.D.   On: 09/15/2021 11:40   US Venous Img Lower Bilateral (DVT)  Result Date: 09/15/2021 CLINICAL DATA:  Bilateral lower extremity edema over the last 2 days. EXAM: Bilateral LOWER EXTREMITY VENOUS DOPPLER ULTRASOUND TECHNIQUE: Gray-scale sonography with compression, as well as color and duplex ultrasound, were performed to evaluate the deep venous system(s) from the level of the common femoral vein through the popliteal and proximal calf veins. COMPARISON:  None Available. FINDINGS: VENOUS Normal compressibility of the common femoral, superficial femoral, and popliteal veins, as well as the visualized calf veins. Visualized portions of profunda femoral vein and great saphenous vein unremarkable. No filling defects to suggest DVT on grayscale  or color Doppler imaging. Doppler waveforms show normal direction of venous flow, normal respiratory plasticity and response to augmentation. Limited views of the contralateral common femoral vein are unremarkable. OTHER None. Limitations: Some limitation due to bandages, but all visualized portions appear negative. IMPRESSION: Negative examination. No evidence of lower extremity DVT on either side. Electronically Signed   By: Nelson Chimes M.D.   On: 09/15/2021 11:20    EKG: Independently reviewed.   Assessment/Plan Principal Problem:   Septic shock  Active Problems:   Hypotension due to hypovolemia   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   Failure  to thrive in adult   Peritoneal carcinomatosis   COPD (chronic obstructive pulmonary disease) (HCC)   Prediabetes   HLD (hyperlipidemia)   Ovarian mass   Refusal of blood transfusions as patient is Jehovah's Witness   Lymphedema   Clavicle fracture -- Left    Hypoglycemia   AKI (acute kidney injury) ([facility])   Lactic acidosis   Leg pain   Hyperbilirubinemia   Leukocytosis   DNR (do not resuscitate)   Uncontrolled pain   Assessment and Plan: * Septic shock  --pt requiring pressor support at this time --admit to ICU, continue central line care and IV norepinephrine support -- continue IV fluid resuscitation   Peritoneal carcinomatosis -- fortunately patient is not experiencing abdominal pain symptoms at this time -- patient had elected over 1 year ago not to undergo systemic treatment and was not a candidate at that time for surgical debulking treatment -- continue palliative care   Failure to thrive in adult -- secondary to untreated 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 Denman George once 1 year ago and elected not to have systemic treatment and was not a candidate for surgical debulking treatment and patient has had no treatment for this since that time and metastatic disease, bulky tumors and carcinomatosis has progressed over the last year  Hypotension due to hypovolemia -- aggressive IV fluid resuscitation per sepsis protocols  Uncontrolled pain -- involving left leg after recent fall at home; patient afraid of pain management but agreeable to trying medications to alleviate symptoms   DNR (do not resuscitate) -- discussed with patient and daughter at bedside and after discussion  patient would like to treat acute illness as we are able but expressed desire for natural death, no resuscitation, no intubation, ok to continue pressors for now, can readdress later if not able to wean off pressors, continue DNR order in hospital  Leukocytosis -- secondary to sepsis, undetermined infection, suspect underlying pneumonia given severe dehydration  Hyperbilirubinemia -- extensive liver involvement of tumor   Leg pain -- IV pain management   Lactic acidosis -- severe and multifactorial given severe dehydration, septic shock, untreated cancer  -- we may not be able to normalize lactate levels in this scenario -- continue IV hydration and antibiotics   AKI (acute kidney injury) ([place]) -- secondary to severe dehydration and likely bulky tumors causing localized compromise to kidney function  -- repeating BMP in AM   Hypoglycemia - hydrate with IV fluid with dextrose   Clavicle fracture -- Left  -- pain management as ordered -- she is wearing a sling for comfort and support   Lymphedema --  longstanding problem but worse in last weeks, supportive measures ordered, try TED hoses if tolerated, elevate legs, no evidence of blood clot from the CT vascular imaging -- obtain venous doppler imaging to rule out deep vein thrombosis  Refusal of blood transfusions as patient is [employer] -- Pt will not accept blood products under any circumstances  Ovarian mass -- tumor size has progressed over the last year since she was diagnosed -- prognosis remains poor   HLD (hyperlipidemia) -- stable   Prediabetes -- stable, no interventions needed   COPD (chronic obstructive pulmonary disease) (HCC) -- stable, bronchodilators as needed   Critical Care Procedure Note Authorized and Performed by: Murvin Natal MD  Total Critical Care time:  55 mins Due to a high probability of clinically significant, life threatening deterioration, the patient required my highest level of  preparedness to intervene emergently and I personally spent this critical care time directly and personally managing the patient.  This critical care time included obtaining a history; examining the patient, pulse oximetry; ordering and review of studies; arranging urgent treatment with development of a management plan; evaluation of patient's response of treatment; frequent reassessment; and discussions with other providers.  This critical care time was performed to assess and manage the high probability of imminent and life threatening deterioration that could result in multi-organ failure.  It was exclusive of separately billable procedures and treating other patients and teaching time.    DVT prophylaxis: enoxaparin   Code Status: DNR  Family Communication: daughter at bedside   Disposition Plan: TBD   Consults called:   Admission status: INP  Level of care: ICU Irwin Brakeman MD [facility] How to contact the [facility] Attending or Consulting provider [place] or covering provider during after hours [place], for this patient?  Check the care team in [facility] and look for a) attending/consulting [facility] provider listed and b) the [facility] team listed Log into [email or web address] and use [place]'s universal password to access. If you do not have the password, please contact the hospital operator. Locate the [facility] provider you are looking for under [facility] and page to a number that you can be directly reached. If you still have difficulty reaching the provider, please page the [facility] (Director on Call) for the Hospitalists listed on amion for assistance.   If 7PM-7AM, please contact night-coverage [email or web address] Password [facility]  09/15/2021, 5:42 PM

## 2021-09-15 NOTE — Assessment & Plan Note (Addendum)
--   pain management as ordered -- she is wearing a sling for comfort and support

## 2021-09-15 NOTE — Assessment & Plan Note (Signed)
--   stable, bronchodilators as needed

## 2021-09-15 NOTE — Assessment & Plan Note (Signed)
--   fortunately patient is not experiencing abdominal pain symptoms at this time -- patient had elected over 1 year ago not to undergo systemic treatment and was not a candidate at that time for surgical debulking treatment -- continue palliative care

## 2021-09-15 NOTE — Assessment & Plan Note (Signed)
--   secondary to severe dehydration and likely bulky tumors causing localized compromise to kidney function  -- repeating BMP in AM

## 2021-09-15 NOTE — Assessment & Plan Note (Signed)
--  pt requiring pressor support at this time --admit to ICU, continue central line care and IV norepinephrine support -- continue IV fluid resuscitation -- DC femoral line and place PICC line

## 2021-09-15 NOTE — Hospital Course (Signed)
71 year old female with history of sarcoidosis, COPD, GERD, chronic lymphedema and diagnosed approximately 1 year ago with stage IV metastatic ovarian cancer.  She was seen by Dr. Denman George once in July 2022 and noted not to be a surgical candidate for tumor debulking.  Patient elected not to undergo systemic treatment at that time.  She was sent home with palliative care.  She has been having twice monthly telephone visits with palliative care.  She is also establish primary care with Dr. Posey Pronto in [place].  Unfortunately she has continued to lose weight over the last year and her appetite has continued to worsen.  She can barely take 1 bite without feeling full.  She only takes sips of liquids.  Unfortunately she had a fall at home and was seen in the emergency department on 09/12/2021 and found to have a left clavicle fracture.  She also injured her left leg.  She continues to have pain and swelling in the left leg.  She denies having abdominal pain.  She has not required pain medication at home over the past year as the ovarian cancer has progressed.  She presented to the emergency department with weakness dizziness that she has been having for the last several days.  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  Patient denies fever and chills.  She has had occasional cough and chest congestion.  She denies dysuria.  Patient was noted to be significantly hypotensive when she arrived in the emergency department with a BP of 66/46.  She was given fluid hydration in the IV however her blood pressures did not improve.  A central line was placed and she was started on pressor support.  Her lactic acid was markedly elevated at 4.6 and her WBC was elevated at 13.7.  Her creatinine was 1.43 with a BUN of 30.  She was hypoglycemic with a blood sugar of 64.  Her bilirubin was elevated at 1.9.  Her hemoglobin was 10.6.  She was started on treatment for septic shock and admission was requested for  further management.

## 2021-09-15 NOTE — Assessment & Plan Note (Signed)
-   hydrate with IV fluid with dextrose

## 2021-09-15 NOTE — Progress Notes (Signed)
Pharmacy Antibiotic Note  Emma Russell is a 71 y.o. female admitted on 09/15/2021 with sepsis.  Pharmacy has been consulted for vancomycin and cefepime dosing. PMH significant for sarcoidosis, COPD, GERD, chronic lymphedema, and diagnosed approximately 1 year ago with stage IV metastatic ovarian cancer. Patient is currently requiring pressors and in AKI.  Plan: Vancomycin '500mg'$  once to give total '1500mg'$  load (~20 mg/kg) Vancomycin '750mg'$  IV every 24 hours.  Goal trough 15-20 mcg/mL. Goal AUC  500 mcg*h/mL Obtain level prior to 4th or 5th dose for further dosing adjustment  Cefepime 2g IV q12h due to renal function  Height: '5\' 3"'$  (160 cm) Weight: 68.8 kg (151 lb 10.8 oz) IBW/kg (Calculated) : 52.4  Temp (24hrs), Avg:98 F (36.7 C), Min:97.4 F (36.3 C), Max:98.5 F (36.9 C)  Recent Labs  Lab 09/15/21 0914 09/15/21 1305  WBC 13.7*  --   CREATININE 1.43*  --   LATICACIDVEN 4.6* 3.9*    Estimated Creatinine Clearance: 34.6 mL/min (A) (by C-G formula based on SCr of 1.43 mg/dL (H)).    Allergies  Allergen Reactions   Codeine Nausea And Vomiting    Antimicrobials this admission: Cefepime 2g x1  7/10 Vancomycin 1g x1  7/10 Metronidazole '500mg'$  IV q12h (7/10>>7/17)  Dose adjustments this admission: N/a  Microbiology results: 7/10 BCx: penidng 7/10 UCx: pending   Thank you for allowing pharmacy to be a part of this patient's care.  Jordan Hawks Emma Russell 09/15/2021 6:41 PM

## 2021-09-15 NOTE — Assessment & Plan Note (Signed)
--   involving left leg after recent fall at home; patient afraid of pain management but agreeable to trying medications to alleviate symptoms

## 2021-09-15 NOTE — Assessment & Plan Note (Signed)
--   tumor size has progressed over the last year since she was diagnosed -- prognosis remains poor

## 2021-09-15 NOTE — ED Notes (Signed)
MD aware of VS and glucose result in lab. States to hand another bolus and increase Levo.

## 2021-09-15 NOTE — ED Provider Notes (Addendum)
[facility] EMERGENCY DEPARTMENT Provider Note   CSN: [id number] Arrival date & time: 09/15/21  0849     History  Chief Complaint  Patient presents with   Leg Pain    Emma Russell is a 71 y.o. female.  Pt is a 71 yo female with a pmhx significant for htn, sarcoidosis, COPD, GERD, lymphedema and metastatic cancer.  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  Pt has refused chemo or any other treatment.  She has been having increased swelling in her arms and legs for several days.  She fell and was seen in the ED on 7/7 and was found to have a fx left clavicle.  Today, her left leg became more painful.  She feels weak and dizzy.  BP at triage 66/46.  Pt denies fevers.       Home Medications Prior to Admission medications   Medication Sig Start Date End Date Taking? Authorizing Provider  Ascorbic Acid (VITAMIN C) 1000 MG tablet Take 2,000 mg by mouth daily.   Yes [provider]  B Complex Vitamins (VITAMIN B COMPLEX) TABS Take 1 tablet by mouth daily.   Yes [provider]  cholecalciferol (VITAMIN D) 25 MCG (1000 UNIT) tablet Take 1,000 Units by mouth daily.   Yes [provider]  Coenzyme Q10 (CO Q 10) 100 MG CAPS Take 1 capsule by mouth daily.   Yes [provider]  Cyanocobalamin (VITAMIN B 12 PO) Take 1 tablet by mouth daily.   Yes [provider]  HORSE CHESTNUT PO Take 1 tablet by mouth daily.   Yes [provider]  Magnesium Gluconate POWD Take 400 mg by mouth daily.   Yes [provider]  Milk Thistle 150 MG CAPS Take 1 capsule by mouth in the morning, at noon, and at bedtime.   Yes [provider]  Turmeric (QC TUMERIC COMPLEX PO) Take 1,350 mg by mouth daily as needed.   Yes [provider]  vitamin k 100 MCG tablet Take 100 mcg by mouth daily.   Yes [provider]  Acetylcysteine (NAC PO) Take by mouth.    [provider]       Allergies    Codeine    Review of Systems   Review of Systems  Constitutional:        Diffuse swelling  Musculoskeletal:        Bilateral leg pain (Left more than Right).  Neurological:  Positive for dizziness and weakness.  All other systems reviewed and are negative.   Physical Exam Updated Vital Signs BP 113/69   Pulse 94   Temp 98.5 F (36.9 C) (Oral)   Resp (!) 21   Ht '5\' 3"'$  (1.6 m)   Wt 63.5 kg   SpO2 100%   BMI 24.80 kg/m  Physical Exam Vitals and nursing note reviewed.  Constitutional:      General: She is in acute distress.     Appearance: She is ill-appearing.  HENT:     Head: Normocephalic and atraumatic.     Right Ear: External ear normal.     Left Ear: External ear normal.     Nose: Nose normal.     Mouth/Throat:     Mouth: Mucous membranes are dry.     Comments: Poor dentition Eyes:     Extraocular Movements: Extraocular movements intact.     Conjunctiva/sclera: Conjunctivae normal.     Pupils: Pupils  are equal, round, and reactive to light.  Cardiovascular:     Rate and Rhythm: Normal rate and regular rhythm.  Pulmonary:     Effort: Pulmonary effort is normal.     Breath sounds: Normal breath sounds.  Abdominal:     General: Bowel sounds are normal.     Comments: Masses felt in abd  Musculoskeletal:     Cervical back: Normal range of motion and neck supple.     Right lower leg: Edema present.     Left lower leg: Edema present.     Comments: Pitting edema up to abdomen  Bruising left upper chest with tenderness over the known clavicle fx  Pulses difficult to feel in legs    Skin:    General: Skin is cool.     Capillary Refill: Capillary refill takes more than 3 seconds.     Coloration: Skin is mottled.     Comments: ? Cellulitis in LE  Neurological:     Mental Status: She is alert and oriented to person, place, and time.  Psychiatric:        Mood and Affect: Mood normal.     ED Results / Procedures / Treatments    Labs (all labs ordered are listed, but only abnormal results are displayed) Labs Reviewed  COMPREHENSIVE METABOLIC PANEL - Abnormal; Notable for the following components:      Result Value   Chloride 112 (*)    CO2 18 (*)    Glucose, Bld 64 (*)    BUN 30 (*)    Creatinine, Ser 1.43 (*)    Calcium 7.6 (*)    Total Protein 3.9 (*)    Albumin 1.7 (*)    Alkaline Phosphatase 134 (*)    Total Bilirubin 1.9 (*)    GFR, Estimated 40 (*)    All other components within normal limits  CBC WITH DIFFERENTIAL/PLATELET - Abnormal; Notable for the following components:   WBC 13.7 (*)    Hemoglobin 10.6 (*)    HCT 31.7 (*)    RDW 16.9 (*)    Neutro Abs 11.7 (*)    Abs Immature Granulocytes 0.11 (*)    All other components within normal limits  LACTIC ACID, PLASMA - Abnormal; Notable for the following components:   Lactic Acid, Venous 4.6 (*)    All other components within normal limits  LACTIC ACID, PLASMA - Abnormal; Notable for the following components:   Lactic Acid, Venous 3.9 (*)    All other components within normal limits  CULTURE, BLOOD (ROUTINE X 2)  CULTURE, BLOOD (ROUTINE X 2)  URINE CULTURE  URINALYSIS, ROUTINE W REFLEX MICROSCOPIC  PROCALCITONIN    EKG EKG Interpretation  Date/Time:  Monday September 15 2021 09:49:00 EDT Ventricular Rate:  91 PR Interval:  125 QRS Duration: 86 QT Interval:  331 QTC Calculation: 408 R Axis:   50 Text Interpretation: Sinus rhythm Low voltage, extremity and precordial leads No significant change since last tracing Confirmed by Isla Pence [phone]) on 09/15/2021 10:27:45 AM  Radiology CT Angio Aortobifemoral W and/or Wo Contrast  Result Date: 09/15/2021 CLINICAL DATA:  Left lower leg pain and swelling for 3 days. Low blood pressure. Metastatic carcinoma. EXAM: CT CHEST, ABDOMEN, AND PELVIS WITH CONTRAST CT ANGIOGRAPHY OF ABDOMINAL AORTA WITH ILIOFEMORAL RUNOFF TECHNIQUE: Multidetector CT imaging of the chest, abdomen and pelvis was  performed following the standard protocol during bolus administration of intravenous contrast. Multidetector CT imaging of the abdomen, pelvis and lower extremities was performed  using the standard protocol during bolus administration of intravenous contrast. Multiplanar CT image reconstructions and MIPs were obtained to evaluate the vascular anatomy. RADIATION DOSE REDUCTION: This exam was performed according to the departmental dose-optimization program which includes automated exposure control, adjustment of the mA and/or kV according to patient size and/or use of iterative reconstruction technique. CONTRAST:  110m OMNIPAQUE IOHEXOL 350 MG/ML SOLN COMPARISON:  CT abdomen pelvis, 09/13/2020. FINDINGS: CT CHEST FINDINGS Cardiovascular: Heart normal in size. No pericardial effusion. Great vessels are normal in caliber. Mild aortic atherosclerosis. Mediastinum/Nodes: No neck base, mediastinal or hilar masses or enlarged lymph nodes. Several small calcified mediastinal and hilar lymph nodes. Trachea and esophagus are unremarkable. Lungs/Pleura: Small to moderate right and small left pleural effusions. Mild bronchiectasis and irregular interstitial thickening, most evident in the upper lobes. Areas of peripheral interstitial thickening have a reticulonodular appearance. Small discrete nodules, left lower lobe, image 79, series 3, 4 mm, right lower lobe, image 67, series 3, 4 mm and right lower lobe, image 57, series 3, 3 mm. Minor dependent atelectasis in the posterior right lower lobe. No pneumothorax. Musculoskeletal: No fracture or acute finding. No osteoblastic or osteolytic lesions. CT ABDOMEN PELVIS FINDINGS Hepatobiliary: Numerous hyperattenuating liver masses in setting of hepatic steatosis and hepatomegaly. Findings have significantly progressed when compared to the exam from 09/13/2020. Small dependent gallstone. No acute cholecystitis. No bile duct dilation. Pancreas: No pancreatic mass or inflammation.  Spleen: Normal in size without focal abnormality. Adrenals/Urinary Tract: No adrenal masses. Symmetric renal enhancement and excretion. 8 mm posterior lower pole left renal mass consistent with a cyst. No other renal masses, no stones and no hydronephrosis. Normal ureters. Bladder minimally distended. There are densities consistent with calcifications along the margin of the posterior bladder wall. No bladder mass. Stomach/Bowel: Stomach unremarkable. Small bowel and colon are normal in caliber. No wall thickening. No convincing inflammation. Vascular/Lymphatic: Aortic atherosclerosis. No aneurysm. No enlarged lymph node. Reproductive: Heterogeneous left pelvic/adnexal masses, largest 6.7 x 4.9 cm transversely. A smaller mass lies anterior and inferior to this, with peripheral calcification, measuring 5.2 x 3.5 cm. Masses are similar to the prior CT. Other: Small amount of ascites. Multiple small nodules along the greater omentum and mesentery consistent with metastatic deposits. Diffuse subcutaneous soft tissue edema. Musculoskeletal: No fracture or acute finding. No osteoblastic or osteolytic lesions. CTA ABDOMEN WITH ILEAL FEMORAL RUNOFF VASCULAR Aorta: Normal in caliber.  Minor atherosclerosis.  No stenosis. Celiac: Included on the abdomen and pelvis CT.  Widely patent. SMA: Included on the abdomen and pelvis CT, widely patent. Renals: Included on the abdomen and pelvis CT. Right widely patent. Left with plaque at the origin, estimated 50% narrowing. IMA: Small but patent. RIGHT Lower Extremity Inflow: Common, internal and external iliac arteries are patent without evidence of aneurysm, dissection, vasculitis or significant stenosis. Outflow: Common, superficial and profunda femoral arteries and the popliteal artery are patent without evidence of aneurysm, dissection, vasculitis or significant stenosis. Runoff: Patent three vessel runoff to the ankle. LEFT Lower Extremity Inflow: Common, internal and external  iliac arteries are patent without evidence of aneurysm, dissection, vasculitis or significant stenosis. Outflow: Common, superficial and profunda femoral arteries and the popliteal artery are patent without evidence of aneurysm, dissection, vasculitis or significant stenosis. Runoff: Patent three vessel runoff to the ankle. Veins: No obvious venous abnormality within the limitations of this arterial phase study. Abdomen and pelvic veins noted on the current CT are patent. Review of the MIP images confirms the above findings. IMPRESSION: CT  CHEST, ABDOMEN AND PELVIS 1. Lung findings with irregular interstitial thickening, there is with a reticulonodular appearance, as well as bronchiectasis and scarring, with an upper lobe predominance, may all be chronic. There may be a component of interstitial edema. 2. Small moderate right and small left pleural effusions. 3. Small pulmonary nodules, could reflect metastatic disease. 4. Liver diffusely enlarged with numerous metastatic lesions superimposed on extensive hepatic steatosis. Liver appearance has markedly progressed when compared to the CT from 09/13/2020. 5. Pelvic masses and peritoneal and omental nodules, as well as ascites, consistent with primary ovarian malignancy and peritoneal carcinomatosis. Ascites is new and mental disease has worsened compared to the prior CT. CTA ABDOMEN AND ILIOFEMORAL RUNOFF VASCULAR 1. No aortic dissection or aneurysm. 2. No significant stenosis of the aorta or its branch vessels. 3. Vessels patent throughout both lower extremities. No vascular findings to account for left lower leg pain and swelling. However, lower extremity veins are not well appreciated on the arterial phase exam. NON-VASCULAR 1. Subcutaneous soft tissue edema extending from the abdomen and pelvis through the lower extremities. 2. No soft tissue mass. No defined fluid collection to suggest an abscess. Electronically Signed   By: Lajean Manes M.D.   On: 09/15/2021  12:59   CT CHEST ABDOMEN PELVIS W CONTRAST  Result Date: 09/15/2021 CLINICAL DATA:  Left lower leg pain and swelling for 3 days. Low blood pressure. Metastatic carcinoma. EXAM: CT CHEST, ABDOMEN, AND PELVIS WITH CONTRAST CT ANGIOGRAPHY OF ABDOMINAL AORTA WITH ILIOFEMORAL RUNOFF TECHNIQUE: Multidetector CT imaging of the chest, abdomen and pelvis was performed following the standard protocol during bolus administration of intravenous contrast. Multidetector CT imaging of the abdomen, pelvis and lower extremities was performed using the standard protocol during bolus administration of intravenous contrast. Multiplanar CT image reconstructions and MIPs were obtained to evaluate the vascular anatomy. RADIATION DOSE REDUCTION: This exam was performed according to the departmental dose-optimization program which includes automated exposure control, adjustment of the mA and/or kV according to patient size and/or use of iterative reconstruction technique. CONTRAST:  173m OMNIPAQUE IOHEXOL 350 MG/ML SOLN COMPARISON:  CT abdomen pelvis, 09/13/2020. FINDINGS: CT CHEST FINDINGS Cardiovascular: Heart normal in size. No pericardial effusion. Great vessels are normal in caliber. Mild aortic atherosclerosis. Mediastinum/Nodes: No neck base, mediastinal or hilar masses or enlarged lymph nodes. Several small calcified mediastinal and hilar lymph nodes. Trachea and esophagus are unremarkable. Lungs/Pleura: Small to moderate right and small left pleural effusions. Mild bronchiectasis and irregular interstitial thickening, most evident in the upper lobes. Areas of peripheral interstitial thickening have a reticulonodular appearance. Small discrete nodules, left lower lobe, image 79, series 3, 4 mm, right lower lobe, image 67, series 3, 4 mm and right lower lobe, image 57, series 3, 3 mm. Minor dependent atelectasis in the posterior right lower lobe. No pneumothorax. Musculoskeletal: No fracture or acute finding. No osteoblastic or  osteolytic lesions. CT ABDOMEN PELVIS FINDINGS Hepatobiliary: Numerous hyperattenuating liver masses in setting of hepatic steatosis and hepatomegaly. Findings have significantly progressed when compared to the exam from 09/13/2020. Small dependent gallstone. No acute cholecystitis. No bile duct dilation. Pancreas: No pancreatic mass or inflammation. Spleen: Normal in size without focal abnormality. Adrenals/Urinary Tract: No adrenal masses. Symmetric renal enhancement and excretion. 8 mm posterior lower pole left renal mass consistent with a cyst. No other renal masses, no stones and no hydronephrosis. Normal ureters. Bladder minimally distended. There are densities consistent with calcifications along the margin of the posterior bladder wall. No bladder mass. Stomach/Bowel: Stomach  unremarkable. Small bowel and colon are normal in caliber. No wall thickening. No convincing inflammation. Vascular/Lymphatic: Aortic atherosclerosis. No aneurysm. No enlarged lymph node. Reproductive: Heterogeneous left pelvic/adnexal masses, largest 6.7 x 4.9 cm transversely. A smaller mass lies anterior and inferior to this, with peripheral calcification, measuring 5.2 x 3.5 cm. Masses are similar to the prior CT. Other: Small amount of ascites. Multiple small nodules along the greater omentum and mesentery consistent with metastatic deposits. Diffuse subcutaneous soft tissue edema. Musculoskeletal: No fracture or acute finding. No osteoblastic or osteolytic lesions. CTA ABDOMEN WITH ILEAL FEMORAL RUNOFF VASCULAR Aorta: Normal in caliber.  Minor atherosclerosis.  No stenosis. Celiac: Included on the abdomen and pelvis CT.  Widely patent. SMA: Included on the abdomen and pelvis CT, widely patent. Renals: Included on the abdomen and pelvis CT. Right widely patent. Left with plaque at the origin, estimated 50% narrowing. IMA: Small but patent. RIGHT Lower Extremity Inflow: Common, internal and external iliac arteries are patent without  evidence of aneurysm, dissection, vasculitis or significant stenosis. Outflow: Common, superficial and profunda femoral arteries and the popliteal artery are patent without evidence of aneurysm, dissection, vasculitis or significant stenosis. Runoff: Patent three vessel runoff to the ankle. LEFT Lower Extremity Inflow: Common, internal and external iliac arteries are patent without evidence of aneurysm, dissection, vasculitis or significant stenosis. Outflow: Common, superficial and profunda femoral arteries and the popliteal artery are patent without evidence of aneurysm, dissection, vasculitis or significant stenosis. Runoff: Patent three vessel runoff to the ankle. Veins: No obvious venous abnormality within the limitations of this arterial phase study. Abdomen and pelvic veins noted on the current CT are patent. Review of the MIP images confirms the above findings. IMPRESSION: CT CHEST, ABDOMEN AND PELVIS 1. Lung findings with irregular interstitial thickening, there is with a reticulonodular appearance, as well as bronchiectasis and scarring, with an upper lobe predominance, may all be chronic. There may be a component of interstitial edema. 2. Small moderate right and small left pleural effusions. 3. Small pulmonary nodules, could reflect metastatic disease. 4. Liver diffusely enlarged with numerous metastatic lesions superimposed on extensive hepatic steatosis. Liver appearance has markedly progressed when compared to the CT from 09/13/2020. 5. Pelvic masses and peritoneal and omental nodules, as well as ascites, consistent with primary ovarian malignancy and peritoneal carcinomatosis. Ascites is new and mental disease has worsened compared to the prior CT. CTA ABDOMEN AND ILIOFEMORAL RUNOFF VASCULAR 1. No aortic dissection or aneurysm. 2. No significant stenosis of the aorta or its branch vessels. 3. Vessels patent throughout both lower extremities. No vascular findings to account for left lower leg pain and  swelling. However, lower extremity veins are not well appreciated on the arterial phase exam. NON-VASCULAR 1. Subcutaneous soft tissue edema extending from the abdomen and pelvis through the lower extremities. 2. No soft tissue mass. No defined fluid collection to suggest an abscess. Electronically Signed   By: Lajean Manes M.D.   On: 09/15/2021 12:59   DG Chest Portable 1 View  Result Date: 09/15/2021 CLINICAL DATA:  Hypotension.  History of sarcoidosis.  Ex-smoker. EXAM: PORTABLE CHEST 1 VIEW COMPARISON:  05/03/2016 FINDINGS: Numerous leads and wires project over the chest. Midline trachea. Normal heart size. No pleural effusion or pneumothorax. Left-greater-than-right, upper lobe predominant marked interstitial thickening with peribronchovascular nodularity. Left upper lobe volume loss with hilar retraction. Relative sparing of the right lung base. Findings are significantly progressive compared to 2018. IMPRESSION: Upper lung predominant peribronchovascular interstitial thickening and nodularity. Most likely related to  the clinical history of sarcoidosis. Superimposed infection, including atypical etiologies, cannot be excluded given interval change since 2018. Electronically Signed   By: Abigail Miyamoto M.D.   On: 09/15/2021 11:40   US Venous Img Lower Bilateral (DVT)  Result Date: 09/15/2021 CLINICAL DATA:  Bilateral lower extremity edema over the last 2 days. EXAM: Bilateral LOWER EXTREMITY VENOUS DOPPLER ULTRASOUND TECHNIQUE: Gray-scale sonography with compression, as well as color and duplex ultrasound, were performed to evaluate the deep venous system(s) from the level of the common femoral vein through the popliteal and proximal calf veins. COMPARISON:  None Available. FINDINGS: VENOUS Normal compressibility of the common femoral, superficial femoral, and popliteal veins, as well as the visualized calf veins. Visualized portions of profunda femoral vein and great saphenous vein unremarkable. No  filling defects to suggest DVT on grayscale or color Doppler imaging. Doppler waveforms show normal direction of venous flow, normal respiratory plasticity and response to augmentation. Limited views of the contralateral common femoral vein are unremarkable. OTHER None. Limitations: Some limitation due to bandages, but all visualized portions appear negative. IMPRESSION: Negative examination. No evidence of lower extremity DVT on either side. Electronically Signed   By: Nelson Chimes M.D.   On: 09/15/2021 11:20    Procedures .Central Line  Date/Time: 09/15/2021 3:05 PM  Performed by: Isla Pence, MD Authorized by: Isla Pence, MD   Consent:    Consent obtained:  Verbal   Consent given by:  Patient Universal protocol:    Patient identity confirmed:  Verbally with patient Pre-procedure details:    Indication(s): central venous access, hemodynamic monitoring and insufficient peripheral access     Hand hygiene: Hand hygiene performed prior to insertion     Sterile barrier technique: All elements of maximal sterile technique followed     Skin preparation:  Chlorhexidine   Skin preparation agent: Skin preparation agent completely dried prior to procedure   Sedation:    Sedation type:  None Anesthesia:    Anesthesia method:  Topical application Procedure details:    Location:  R femoral   Patient position:  Supine   Procedural supplies:  Triple lumen   Catheter size:  7 Fr   Landmarks identified: yes     Ultrasound guidance: no     Number of attempts:  1   Successful placement: yes   Post-procedure details:    Post-procedure:  Dressing applied and line sutured   Assessment:  Blood return through all ports and free fluid flow   Procedure completion:  Tolerated well, no immediate complications     Medications Ordered in ED Medications  norepinephrine (LEVOPHED) '4mg'$  in 275m (0.016 mg/mL) premix infusion (18 mcg/min Intravenous Rate/Dose Change 09/15/21 1510)  vancomycin  (VANCOCIN) IVPB 1000 mg/200 mL premix (1,000 mg Intravenous New Bag/Given 09/15/21 1509)  ceFEPIme (MAXIPIME) 2 g in sodium chloride 0.9 % 100 mL IVPB (2 g Intravenous New Bag/Given 09/15/21 1510)  sodium chloride 0.9 % bolus 1,000 mL (0 mLs Intravenous Stopped 09/15/21 1250)  0.9 %  sodium chloride infusion (0 mLs Intravenous Stopped 09/15/21 1354)  iohexol (OMNIPAQUE) 350 MG/ML injection 100 mL (100 mLs Intravenous Contrast Given 09/15/21 1138)  fentaNYL (SUBLIMAZE) injection 25 mcg (25 mcg Intravenous Given 09/15/21 1307)  fentaNYL (SUBLIMAZE) injection 50 mcg (50 mcg Intravenous Given 09/15/21 1453)    ED Course/ Medical Decision Making/ A&P                           Medical Decision  Making Amount and/or Complexity of Data Reviewed Labs: ordered. Radiology: ordered.  Risk Prescription drug management. Decision regarding hospitalization.   This patient presents to the ED for concern of hypotension and leg pain, this involves an extensive number of treatment options, and is a complaint that carries with it a high risk of complications and morbidity.  The differential diagnosis includes sepsis, arterial occlusion, infection, dvt   Co morbidities that complicate the patient evaluation  htn, sarcoidosis, COPD, GERD, lymphedema and metastatic cancer   Additional history obtained:  Additional history obtained from epic chart review External records from outside source obtained and reviewed including daughter   Lab Tests:  I Ordered, and personally interpreted labs.  The pertinent results include:  cbc with hgb 10.6 (hgb 14.3 1 yr ago); cmp with BUN 30 and Cr 1.43 (bun 14 and Cr 0.87 1 year ago)   Imaging Studies ordered:  I ordered imaging studies including cxr, ct chest/abd/pelvis, ct angio legs; [place] LE I independently visualized and interpreted imaging which showed  CXR: IMPRESSION:  Upper lung predominant peribronchovascular interstitial thickening  and nodularity. Most likely  related to the clinical history of  sarcoidosis. Superimposed infection, including atypical etiologies,  cannot be excluded given interval change since 2018.  [place] LE: IMPRESSION:  Negative examination. No evidence of lower extremity DVT on either  side.  CT chest/abd/pelvis + cta: CT CHEST, ABDOMEN AND PELVIS    1. Lung findings with irregular interstitial thickening, there is  with a reticulonodular appearance, as well as bronchiectasis and  scarring, with an upper lobe predominance, may all be chronic. There  may be a component of interstitial edema.  2. Small moderate right and small left pleural effusions.  3. Small pulmonary nodules, could reflect metastatic disease.  4. Liver diffusely enlarged with numerous metastatic lesions  superimposed on extensive hepatic steatosis. Liver appearance has  markedly progressed when compared to the CT from 09/13/2020.  5. Pelvic masses and peritoneal and omental nodules, as well as  ascites, consistent with primary ovarian malignancy and peritoneal  carcinomatosis. Ascites is new and mental disease has worsened  compared to the prior CT.    CTA ABDOMEN AND ILIOFEMORAL RUNOFF    VASCULAR    1. No aortic dissection or aneurysm.  2. No significant stenosis of the aorta or its branch vessels.  3. Vessels patent throughout both lower extremities. No vascular  findings to account for left lower leg pain and swelling. However,  lower extremity veins are not well appreciated on the arterial phase  exam.    NON-VASCULAR    1. Subcutaneous soft tissue edema extending from the abdomen and  pelvis through the lower extremities.  2. No soft tissue mass. No defined fluid collection to suggest an  abscess.      Electronically Signed    By: Lajean Manes M.D.    On: 09/15/2021 12:59   I agree with the radiologist interpretation   Cardiac Monitoring:  The patient was maintained on a cardiac monitor.  I personally viewed and interpreted the  cardiac monitored which showed an underlying rhythm of: nsr   Medicines ordered and prescription drug management:  I ordered medication including ivfs and levophed  for hypotension  Reevaluation of the patient after these medicines showed that the patient improved I have reviewed the patients home medicines and have made adjustments as needed   Test Considered:  ct   Critical Interventions:  ivfs   Consultations Obtained:  I requested consultation with the  pulmonologist (Dr. Halford Chessman),  and discussed lab and imaging findings as well as pertinent plan - He said pt can stay here.  If the hospitalists need him, they can call him. Pt d/w Dr. Wynetta Emery ([facility]) for admission.     Problem List / ED Course:  Hypotension:  pt given IVFs first and central line placed.  Pt started on levophed.  Bp improving.  Initially, I was not sure what the hypotension was from.  She had a recent fall, so I was not sure if it was a traumatic injury or not.  CT did not show any evidence of trauma.  Lactic elevated, so pt given ivfs.  She is started on vanc and cefepime for possible cellulitis and sepsis.  Urine pending on admission.  Pt had refused catheter until just a few minutes ago.  She was trying to urinate on her own.  Once foley placed, pt has not had a lot of output.  Urine sent for culture. Left leg pain:  no dvt, no arterial occlusion.  ? Cellulitis vs hematoma from recent fall Metastatic cancer:  pt has not wanted any treatment.  Cancer is getting worse.  Pt is still full code.  I spoke with pt and she is going to think about what she wants as code status. Anasarca: albumin only 1.7.  She has had very poor oral intake.   Reevaluation:  After the interventions noted above, I reevaluated the patient and found that they have :improved   Social Determinants of Health:  Lives at home   Dispostion:  After consideration of the diagnostic results and the patients response to treatment, I feel that  the patent would benefit from admission.    CRITICAL CARE Performed by: Isla Pence   Total critical care time: 60 minutes  Critical care time was exclusive of separately billable procedures and treating other patients.  Critical care was necessary to treat or prevent imminent or life-threatening deterioration.  Critical care was time spent personally by me on the following activities: development of treatment plan with patient and/or surrogate as well as nursing, discussions with consultants, evaluation of patient's response to treatment, examination of patient, obtaining history from patient or surrogate, ordering and performing treatments and interventions, ordering and review of laboratory studies, ordering and review of radiographic studies, pulse oximetry and re-evaluation of patient's condition.         Final Clinical Impression(s) / ED Diagnoses Final diagnoses:  Malignant neoplasm of ovary, unspecified laterality ([place])  Metastatic malignant neoplasm, unspecified site ([place])  Anemia, unspecified type  Hypotension due to hypovolemia  Anasarca    Rx / DC Orders ED Discharge Orders     None         Isla Pence, MD 09/15/21 1513    Isla Pence, MD 09/15/21 1515

## 2021-09-15 NOTE — Assessment & Plan Note (Signed)
--   patient met with Dr. Denman George once 1 year ago and elected not to have systemic treatment and was not a candidate for surgical debulking treatment and patient has had no treatment for this since that time and metastatic disease, bulky tumors and carcinomatosis has progressed over the last year

## 2021-09-15 NOTE — ED Notes (Signed)
EDP aware of VS

## 2021-09-15 NOTE — Assessment & Plan Note (Signed)
--   secondary to sepsis, undetermined infection, suspect underlying pneumonia given severe dehydration

## 2021-09-15 NOTE — Progress Notes (Addendum)
When patient arrived to unit from ED, Dressing to femoral line was saturated and dripping clear colored fluids outside dressing.  Patient denied any pain at site. Ports flushed and blood return noted from all 3 ports. Dr Wynetta Emery made aware and in to assess. Legs feel tight due to fluid and weeping in places. OK to still use femoral line per verbal from Dr Wynetta Emery. Dressing changed and antimicrobial patch placed also.

## 2021-09-15 NOTE — Assessment & Plan Note (Signed)
--   extensive liver involvement of tumor

## 2021-09-15 NOTE — Assessment & Plan Note (Signed)
--   severe and multifactorial given severe dehydration, septic shock, untreated cancer  -- we may not be able to normalize lactate levels in this scenario -- lactate not responding to fluid boluses, will stop checking. It is likely elevated as a result of the invasive cancer, more specifically the advanced liver metastases

## 2021-09-15 NOTE — ED Triage Notes (Signed)
Patient with complaints of left lower leg pain that started 2 days prior. Swelling noted to lower extremity.

## 2021-09-15 NOTE — Assessment & Plan Note (Signed)
--   IV pain management

## 2021-09-15 NOTE — Assessment & Plan Note (Signed)
--   aggressive IV fluid resuscitation per sepsis protocols

## 2021-09-16 ENCOUNTER — Encounter (HOSPITAL_COMMUNITY): Payer: Self-pay | Admitting: Family Medicine

## 2021-09-16 ENCOUNTER — Inpatient Hospital Stay (HOSPITAL_COMMUNITY): Payer: Medicare Other

## 2021-09-16 ENCOUNTER — Inpatient Hospital Stay: Payer: Self-pay

## 2021-09-16 DIAGNOSIS — Z7189 Other specified counseling: Secondary | ICD-10-CM

## 2021-09-16 DIAGNOSIS — A419 Sepsis, unspecified organism: Secondary | ICD-10-CM | POA: Diagnosis not present

## 2021-09-16 DIAGNOSIS — R601 Generalized edema: Secondary | ICD-10-CM | POA: Diagnosis not present

## 2021-09-16 DIAGNOSIS — Z515 Encounter for palliative care: Secondary | ICD-10-CM | POA: Diagnosis not present

## 2021-09-16 DIAGNOSIS — D649 Anemia, unspecified: Secondary | ICD-10-CM

## 2021-09-16 DIAGNOSIS — N390 Urinary tract infection, site not specified: Secondary | ICD-10-CM | POA: Diagnosis present

## 2021-09-16 DIAGNOSIS — L8990 Pressure ulcer of unspecified site, unspecified stage: Secondary | ICD-10-CM | POA: Diagnosis present

## 2021-09-16 DIAGNOSIS — N179 Acute kidney failure, unspecified: Secondary | ICD-10-CM | POA: Diagnosis not present

## 2021-09-16 DIAGNOSIS — E8809 Other disorders of plasma-protein metabolism, not elsewhere classified: Secondary | ICD-10-CM | POA: Diagnosis present

## 2021-09-16 DIAGNOSIS — R6521 Severe sepsis with septic shock: Secondary | ICD-10-CM | POA: Diagnosis not present

## 2021-09-16 LAB — CBC WITH DIFFERENTIAL/PLATELET
Abs Immature Granulocytes: 0.67 10*3/uL — ABNORMAL HIGH (ref 0.00–0.07)
Basophils Absolute: 0.1 10*3/uL (ref 0.0–0.1)
Basophils Relative: 1 %
Eosinophils Absolute: 0 10*3/uL (ref 0.0–0.5)
Eosinophils Relative: 0 %
HCT: 31.5 % — ABNORMAL LOW (ref 36.0–46.0)
Hemoglobin: 10.2 g/dL — ABNORMAL LOW (ref 12.0–15.0)
Immature Granulocytes: 4 %
Lymphocytes Relative: 9 %
Lymphs Abs: 1.6 10*3/uL (ref 0.7–4.0)
MCH: 27.1 pg (ref 26.0–34.0)
MCHC: 32.4 g/dL (ref 30.0–36.0)
MCV: 83.6 fL (ref 80.0–100.0)
Monocytes Absolute: 0.7 10*3/uL (ref 0.1–1.0)
Monocytes Relative: 4 %
Neutro Abs: 15.2 10*3/uL — ABNORMAL HIGH (ref 1.7–7.7)
Neutrophils Relative %: 82 %
Platelets: 269 10*3/uL (ref 150–400)
RBC: 3.77 MIL/uL — ABNORMAL LOW (ref 3.87–5.11)
RDW: 17.3 % — ABNORMAL HIGH (ref 11.5–15.5)
WBC: 18.3 10*3/uL — ABNORMAL HIGH (ref 4.0–10.5)
nRBC: 0 % (ref 0.0–0.2)

## 2021-09-16 LAB — COMPREHENSIVE METABOLIC PANEL
ALT: 30 U/L (ref 0–44)
AST: 57 U/L — ABNORMAL HIGH (ref 15–41)
Albumin: 1.6 g/dL — ABNORMAL LOW (ref 3.5–5.0)
Alkaline Phosphatase: 121 U/L (ref 38–126)
Anion gap: 7 (ref 5–15)
BUN: 32 mg/dL — ABNORMAL HIGH (ref 8–23)
CO2: 16 mmol/L — ABNORMAL LOW (ref 22–32)
Calcium: 6.8 mg/dL — ABNORMAL LOW (ref 8.9–10.3)
Chloride: 111 mmol/L (ref 98–111)
Creatinine, Ser: 1.76 mg/dL — ABNORMAL HIGH (ref 0.44–1.00)
GFR, Estimated: 31 mL/min — ABNORMAL LOW (ref >60)
Glucose, Bld: 133 mg/dL — ABNORMAL HIGH (ref 70–99)
Potassium: 4.1 mmol/L (ref 3.5–5.1)
Sodium: 134 mmol/L — ABNORMAL LOW (ref 135–145)
Total Bilirubin: 1.9 mg/dL — ABNORMAL HIGH (ref 0.3–1.2)
Total Protein: 3.9 g/dL — ABNORMAL LOW (ref 6.5–8.1)

## 2021-09-16 LAB — LACTIC ACID, PLASMA
Lactic Acid, Venous: 4.2 mmol/L (ref 0.5–1.9)
Lactic Acid, Venous: 4.7 mmol/L (ref 0.5–1.9)

## 2021-09-16 LAB — MAGNESIUM: Magnesium: 1.7 mg/dL (ref 1.7–2.4)

## 2021-09-16 LAB — GLUCOSE, CAPILLARY
Glucose-Capillary: 109 mg/dL — ABNORMAL HIGH (ref 70–99)
Glucose-Capillary: 111 mg/dL — ABNORMAL HIGH (ref 70–99)
Glucose-Capillary: 113 mg/dL — ABNORMAL HIGH (ref 70–99)
Glucose-Capillary: 95 mg/dL (ref 70–99)

## 2021-09-16 LAB — HIV ANTIBODY (ROUTINE TESTING W REFLEX): HIV Screen 4th Generation wRfx: NONREACTIVE

## 2021-09-16 LAB — PROCALCITONIN: Procalcitonin: 35.69 ng/mL

## 2021-09-16 MED ORDER — FUROSEMIDE 10 MG/ML IJ SOLN
40.0000 mg | Freq: Once | INTRAMUSCULAR | Status: AC
  Administered 2021-09-16: 40 mg via INTRAVENOUS
  Filled 2021-09-16: qty 4

## 2021-09-16 MED ORDER — VANCOMYCIN HCL 1250 MG/250ML IV SOLN: 1250.0000 mg | INTRAVENOUS | Status: DC

## 2021-09-16 MED ORDER — MUPIROCIN 2 % EX OINT
TOPICAL_OINTMENT | Freq: Two times a day (BID) | CUTANEOUS | Status: DC
  Administered 2021-09-16: 1 via NASAL
  Filled 2021-09-16: qty 22

## 2021-09-16 MED ORDER — LACTATED RINGERS IV BOLUS
500.0000 mL | Freq: Once | INTRAVENOUS | Status: AC
Start: 2021-09-16 — End: 2021-09-17
  Administered 2021-09-16: 500 mL via INTRAVENOUS

## 2021-09-16 MED ORDER — SODIUM CHLORIDE 0.9% FLUSH: 10.0000 mL | INTRAVENOUS | Status: DC | PRN

## 2021-09-16 MED ORDER — LACTATED RINGERS IV BOLUS
1000.0000 mL | Freq: Once | INTRAVENOUS | Status: AC
  Administered 2021-09-16: 1000 mL via INTRAVENOUS

## 2021-09-16 MED ORDER — ENOXAPARIN SODIUM 30 MG/0.3ML IJ SOSY
30.0000 mg | PREFILLED_SYRINGE | INTRAMUSCULAR | Status: DC
  Administered 2021-09-16 – 2021-09-17 (×2): 30 mg via SUBCUTANEOUS
  Filled 2021-09-16 (×2): qty 0.3

## 2021-09-16 MED ORDER — ALBUMIN HUMAN 25 % IV SOLN
25.0000 g | Freq: Four times a day (QID) | INTRAVENOUS | Status: AC
  Administered 2021-09-16 – 2021-09-17 (×3): 25 g via INTRAVENOUS
  Filled 2021-09-16 (×3): qty 100

## 2021-09-16 MED ORDER — SODIUM CHLORIDE 0.9% FLUSH
10.0000 mL | Freq: Two times a day (BID) | INTRAVENOUS | Status: DC
  Administered 2021-09-16 – 2021-09-18 (×4): 10 mL

## 2021-09-16 MED ORDER — BOOST / RESOURCE BREEZE PO LIQD CUSTOM
1.0000 | Freq: Three times a day (TID) | ORAL | Status: DC
  Administered 2021-09-16 – 2021-09-18 (×4): 1 via ORAL

## 2021-09-16 MED ORDER — SODIUM CHLORIDE 0.9 % IV SOLN
2.0000 g | INTRAVENOUS | Status: DC
  Administered 2021-09-17 – 2021-09-18 (×2): 2 g via INTRAVENOUS
  Filled 2021-09-16 (×2): qty 12.5

## 2021-09-16 MED ORDER — HYDROXYZINE HCL 25 MG PO TABS: 50.0000 mg | ORAL_TABLET | Freq: Three times a day (TID) | ORAL | Status: DC | PRN

## 2021-09-16 NOTE — Progress Notes (Addendum)
Dr Wynetta Emery made aware of patient urine output since 7am. Bolus's during shift have been ordered and given. Patient alert and oriented all shift with no complaints of pain, shortness of breath or discomfort. Levo titrated down accordingly per blood pressures. Dr Wynetta Emery aware. Report given to night RN who will continue to monitor.

## 2021-09-16 NOTE — Progress Notes (Signed)
09/16/2021 1:25 PM  Received consent from patient and daughter to give IV albumin and they were notified that it was considered a blood product and they verbalized understanding and I gave them additional time to think about it and they have agreed to take the IV albumin.  Murvin Natal, MD

## 2021-09-16 NOTE — Progress Notes (Signed)
Pharmacy Antibiotic Note  Emma Russell is a 71 y.o. female admitted on 09/15/2021 with sepsis.  Pharmacy has been consulted for vancomycin and cefepime dosing. PMH significant for sarcoidosis, COPD, GERD, chronic lymphedema, and diagnosed approximately 1 year ago with stage IV metastatic ovarian cancer. Patient is currently requiring pressors and in AKI. Scr 1.64> 1.76, crcl 14ms/min.   Plan: Decrease Vancomycin 1250 mg IV Q 48 hrs. Goal AUC 400-550. Expected AUC: 502 SCr used: 1.76  Decrease Cefepime 2g IV q24h due to renal function F/U cxs and clinical progress Monitor V/S, labs and levels as indicated  Height: '5\' 3"'$  (160 cm) Weight: 68.8 kg (151 lb 10.8 oz) IBW/kg (Calculated) : 52.4  Temp (24hrs), Avg:97.7 F (36.5 C), Min:97.4 F (36.3 C), Max:98.5 F (36.9 C)  Recent Labs  Lab 09/15/21 0914 09/15/21 1305 09/15/21 1852 09/15/21 2052 09/15/21 2317 09/16/21 0341 09/16/21 0652  WBC 13.7*  --  18.7*  --   --  18.3*  --   CREATININE 1.43*  --  1.64*  --   --  1.76*  --   LATICACIDVEN 4.6* 3.9* 4.9* 5.3* 5.1*  --  4.2*     Estimated Creatinine Clearance: 28.1 mL/min (A) (by C-G formula based on SCr of 1.76 mg/dL (H)).    Allergies  Allergen Reactions   Codeine Nausea And Vomiting    Antimicrobials this admission: Cefepime 2g x1  7/10 Vancomycin 1g x1  7/10 Metronidazole '500mg'$  IV q12h (7/10>>7/17)  Dose adjustments this admission: N/a  Microbiology results: 7/10 BCx: penidng 7/10 UCx: pending   Thank you for allowing pharmacy to be a part of this patient's care.  LIsac Sarna BS Pharm D, BCaliforniaClinical Pharmacist Pager #[phone]/01/2022 7:45 AM

## 2021-09-16 NOTE — Assessment & Plan Note (Signed)
--   after meeting with palliative patient and daughter consented to IV albumin understanding that it is considered a blood product and I made sure that they understood that and I gave them additional time to think about it and they are agreeable to the IV albumin -- IV albumin 25 g IV Q6h x 3 doses, IV lasix x 1 dose after first dose of IV albumin

## 2021-09-16 NOTE — Progress Notes (Signed)
PROGRESS NOTE   Emma Russell  VHQ:[id number] DOB: 07/21/1950 DOA: 09/15/2021 PCP: Lindell Spar, MD   Chief Complaint  Patient presents with   Leg Pain   Level of care: ICU  Brief Admission History:  71 year old female with history of sarcoidosis, COPD, GERD, chronic lymphedema and diagnosed approximately 1 year ago with stage IV metastatic ovarian cancer.  She was seen by Dr. Denman George once in July 2022 and noted not to be a surgical candidate for tumor debulking.  Patient elected not to undergo systemic treatment at that time.  She was sent home with palliative care.  She has been having twice monthly telephone visits with palliative care.  She is also establish primary care with Dr. Posey Pronto in [place].  Unfortunately she has continued to lose weight over the last year and her appetite has continued to worsen.  She can barely take 1 bite without feeling full.  She only takes sips of liquids.  Unfortunately she had a fall at home and was seen in the emergency department on 09/12/2021 and found to have a left clavicle fracture.  She also injured her left leg.  She continues to have pain and swelling in the left leg.  She denies having abdominal pain.  She has not required pain medication at home over the past year as the ovarian cancer has progressed.  She presented to the emergency department with weakness dizziness that she has been having for the last several days.  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  Patient denies fever and chills.  She has had occasional cough and chest congestion.  She denies dysuria.  Patient was noted to be significantly hypotensive when she arrived in the emergency department with a BP of 66/46.  She was given fluid hydration in the IV however her blood pressures did not improve.  A central line was placed and she was started on pressor support.  Her lactic acid was markedly elevated at 4.6 and her WBC was elevated at 13.7.  Her creatinine  was 1.43 with a BUN of 30.  She was hypoglycemic with a blood sugar of 64.  Her bilirubin was elevated at 1.9.  Her hemoglobin was 10.6.  She was started on treatment for septic shock and admission was requested for further management.   Assessment and Plan: * Septic shock  --pt requiring pressor support at this time --admit to ICU, continue central line care and IV norepinephrine support -- continue IV fluid resuscitation -- DC femoral line and place PICC line    Peritoneal carcinomatosis -- fortunately patient is not experiencing abdominal pain symptoms at this time -- patient had elected over 1 year ago not to undergo systemic treatment and was not a candidate at that time for surgical debulking treatment -- continue palliative care   Failure to thrive in adult -- secondary to untreated 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 Denman George once 1 year ago and elected not to have systemic treatment and was not a candidate for surgical debulking treatment and patient has had no treatment for this since that time and metastatic disease, bulky tumors and carcinomatosis has progressed over the last year  Hypotension due to hypovolemia --  aggressive IV fluid resuscitation per sepsis protocols  Hypoalbuminemia -- after meeting with palliative patient and daughter consented to IV albumin understanding that it is considered a blood product and I made sure that they understood that and I gave them additional time to think about it and they are agreeable to the IV albumin -- IV albumin 25 g IV Q6h x 3 doses, IV lasix x 1 dose after first dose of IV albumin   Uncontrolled pain -- involving left leg after  recent fall at home; patient afraid of pain management but agreeable to trying medications to alleviate symptoms   DNR (do not resuscitate) -- discussed with patient and daughter at bedside and after discussion patient would like to treat acute illness as we are able but expressed desire for natural death, no resuscitation, no intubation, ok to continue pressors for now, can readdress later if not able to wean off pressors, continue DNR order in hospital  Leukocytosis -- secondary to sepsis, undetermined infection, suspect underlying pneumonia given severe dehydration  Hyperbilirubinemia -- extensive liver involvement of tumor   Leg pain -- IV pain management   Lactic acidosis from advanced liver metastases and tumor burden -- severe and multifactorial given severe dehydration, septic shock, untreated cancer  -- we may not be able to normalize lactate levels in this scenario -- lactate not responding to fluid boluses, will stop checking. It is likely elevated as a result of the invasive cancer, more specifically the advanced liver metastases   AKI (acute kidney injury) ([place]) -- secondary to severe dehydration and likely bulky tumors causing localized compromise to kidney function  -- repeating BMP in AM   Hypoglycemia - hydrate with IV fluid with dextrose   Clavicle fracture -- Left  -- pain management as ordered -- she is wearing a sling for comfort and support   Lymphedema -- longstanding problem but worse in last weeks, supportive measures ordered, try TED hoses if tolerated, elevate legs, no evidence of blood clot from the CT vascular imaging -- venous doppler imaging to ruled out deep vein thrombosis -- IV albumin and lasix ordered   Refusal of blood transfusions as patient is [employer] -- Pt will not accept blood products under any circumstances  Ovarian mass -- tumor size has progressed over the last year since she was diagnosed -- prognosis remains poor   HLD  (hyperlipidemia) -- stable   Prediabetes -- stable, no interventions needed   COPD (chronic obstructive pulmonary disease) (HCC) -- stable, bronchodilators as needed   DVT prophylaxis: enoxaparin  Code Status: DNR  Family Communication: daughter at bedside  Disposition: Status is: Inpatient Remains inpatient appropriate because: intensity    Consultants:  Palliative care  Procedures:  Femoral line placed in ED 7/10 Pending PICC line 7/11 and femoral line removal 7/11 Antimicrobials:  Cefepime/vancomycin 7/10>>  Subjective: Pt reports she slept better and less leg pain today.   Objective: Vitals:   09/16/21 0800 09/16/21 0815 09/16/21 0830 09/16/21 1128  BP: 106/68 110/61 112/67   Pulse: 94 93 94 92  Resp: 20 12 (!) 22 (!) 21  Temp:    (!) 97.2 F (36.2 C)  TempSrc:    Oral  SpO2: 100% 99% 100% 99%  Weight:      Height:        Intake/Output Summary (Last 24 hours) at 09/16/2021 1335 Last data filed at 09/16/2021 1130 Gross per 24 hour  Intake 4509.29 ml  Output 200 ml  Net 4309.29 ml  Filed Weights   09/15/21 0900 09/15/21 1721  Weight: 63.5 kg 68.8 kg   Examination:  General exam: Appears calm and comfortable  Respiratory system: Clear to auscultation. Rare basilar crackles heard.  Respiratory effort normal. Cardiovascular system: normal S1 & S2 heard. No JVD, murmurs, rubs, gallops or clicks. No pedal edema. Gastrointestinal system: Abdomen with hard hepatomegaly, nontender, normal BS.  Central nervous system: Alert and oriented. No focal neurological deficits. Extremities: Symmetric 5 x 5 power. Skin: 2+ pitting edema BLEs.  No rashes, lesions or ulcers. Psychiatry: Judgement and insight appear normal. Mood & affect appropriate.   Data Reviewed: I have personally reviewed following labs and imaging studies  CBC: Recent Labs  Lab 09/15/21 0914 09/15/21 1852 09/16/21 0341  WBC 13.7* 18.7* 18.3*  NEUTROABS 11.7*  --  15.2*  HGB 10.6* 10.8* 10.2*   HCT 31.7* 32.7* 31.5*  MCV 81.3 81.5 83.6  PLT 320 311 161    Basic Metabolic Panel: Recent Labs  Lab 09/15/21 0914 09/15/21 1852 09/16/21 0341  NA 137  --  134*  K 3.5  --  4.1  CL 112*  --  111  CO2 18*  --  16*  GLUCOSE 64*  --  133*  BUN 30*  --  32*  CREATININE 1.43* 1.64* 1.76*  CALCIUM 7.6*  --  6.8*  MG  --   --  1.7    CBG: Recent Labs  Lab 09/15/21 1839 09/16/21 0029 09/16/21 0620 09/16/21 1126  GLUCAP 85 113* 95 111*    Recent Results (from the past 240 hour(s))  Culture, blood (routine x 2)     Status: None (Preliminary result)   Collection Time: 09/15/21  9:50 AM   Specimen: BLOOD RIGHT FOREARM  Result Value Ref Range Status   Specimen Description BLOOD RIGHT FOREARM  Final   Special Requests   Final    BOTTLES DRAWN AEROBIC AND ANAEROBIC Blood Culture adequate volume   Culture   Final    NO GROWTH < 24 HOURS Performed at [facility], [address], [place], [place] [address]    Report Status PENDING  Incomplete  Culture, blood (routine x 2)     Status: None (Preliminary result)   Collection Time: 09/15/21 10:39 AM   Specimen: BLOOD RIGHT FOREARM  Result Value Ref Range Status   Specimen Description BLOOD RIGHT FOREARM  Final   Special Requests   Final    BOTTLES DRAWN AEROBIC ONLY Blood Culture results may not be optimal due to an inadequate volume of blood received in culture bottles   Culture   Final    NO GROWTH < 24 HOURS Performed at [facility], [address], [place], [place] [address]    Report Status PENDING  Incomplete  MRSA Next Gen by PCR, Nasal     Status: Abnormal   Collection Time: 09/15/21  5:00 PM   Specimen: Nasal Mucosa; Nasal Swab  Result Value Ref Range Status   MRSA by PCR Next Gen DETECTED (A) NOT DETECTED Final    Comment: RESULT CALLED TO, READ BACK BY AND VERIFIED WITHWilleen Niece RN [id number] [id number] K FORSYTH (NOTE) The GeneXpert MRSA Assay (FDA approved for NASAL specimens only), is one component of a  comprehensive MRSA colonization surveillance program. It is not intended to diagnose MRSA infection nor to guide or monitor treatment for MRSA infections. Test performance is not FDA approved in patients less than 23 years old. Performed at [facility], [address] [place],  [place] [address]      Radiology Studies: DG CHEST PORT 1 VIEW  Result Date: 09/16/2021 CLINICAL DATA:  Sepsis EXAM: PORTABLE CHEST 1 VIEW COMPARISON:  Chest x-ray dated September 15, 2021 FINDINGS: The heart size and mediastinal contours are within normal limits. Unchanged mild bilateral heterogeneous opacities. Unchanged linear opacity of the left upper lung, likely due to scarring. The visualized skeletal structures are unremarkable. IMPRESSION: Unchanged mild bilateral heterogeneous opacities, likely due to pulmonary edema. Electronically Signed   By: Yetta Glassman M.D.   On: 09/16/2021 10:44   [place] EKG SITE RITE  Result Date: 09/16/2021 If Site Rite image not attached, placement could not be confirmed due to current cardiac rhythm.  CT Angio Aortobifemoral W and/or Wo Contrast  Result Date: 09/15/2021 CLINICAL DATA:  Left lower leg pain and swelling for 3 days. Low blood pressure. Metastatic carcinoma. EXAM: CT CHEST, ABDOMEN, AND PELVIS WITH CONTRAST CT ANGIOGRAPHY OF ABDOMINAL AORTA WITH ILIOFEMORAL RUNOFF TECHNIQUE: Multidetector CT imaging of the chest, abdomen and pelvis was performed following the standard protocol during bolus administration of intravenous contrast. Multidetector CT imaging of the abdomen, pelvis and lower extremities was performed using the standard protocol during bolus administration of intravenous contrast. Multiplanar CT image reconstructions and MIPs were obtained to evaluate the vascular anatomy. RADIATION DOSE REDUCTION: This exam was performed according to the departmental dose-optimization program which includes automated exposure control, adjustment of the mA and/or kV according to  patient size and/or use of iterative reconstruction technique. CONTRAST:  178m OMNIPAQUE IOHEXOL 350 MG/ML SOLN COMPARISON:  CT abdomen pelvis, 09/13/2020. FINDINGS: CT CHEST FINDINGS Cardiovascular: Heart normal in size. No pericardial effusion. Great vessels are normal in caliber. Mild aortic atherosclerosis. Mediastinum/Nodes: No neck base, mediastinal or hilar masses or enlarged lymph nodes. Several small calcified mediastinal and hilar lymph nodes. Trachea and esophagus are unremarkable. Lungs/Pleura: Small to moderate right and small left pleural effusions. Mild bronchiectasis and irregular interstitial thickening, most evident in the upper lobes. Areas of peripheral interstitial thickening have a reticulonodular appearance. Small discrete nodules, left lower lobe, image 79, series 3, 4 mm, right lower lobe, image 67, series 3, 4 mm and right lower lobe, image 57, series 3, 3 mm. Minor dependent atelectasis in the posterior right lower lobe. No pneumothorax. Musculoskeletal: No fracture or acute finding. No osteoblastic or osteolytic lesions. CT ABDOMEN PELVIS FINDINGS Hepatobiliary: Numerous hyperattenuating liver masses in setting of hepatic steatosis and hepatomegaly. Findings have significantly progressed when compared to the exam from 09/13/2020. Small dependent gallstone. No acute cholecystitis. No bile duct dilation. Pancreas: No pancreatic mass or inflammation. Spleen: Normal in size without focal abnormality. Adrenals/Urinary Tract: No adrenal masses. Symmetric renal enhancement and excretion. 8 mm posterior lower pole left renal mass consistent with a cyst. No other renal masses, no stones and no hydronephrosis. Normal ureters. Bladder minimally distended. There are densities consistent with calcifications along the margin of the posterior bladder wall. No bladder mass. Stomach/Bowel: Stomach unremarkable. Small bowel and colon are normal in caliber. No wall thickening. No convincing inflammation.  Vascular/Lymphatic: Aortic atherosclerosis. No aneurysm. No enlarged lymph node. Reproductive: Heterogeneous left pelvic/adnexal masses, largest 6.7 x 4.9 cm transversely. A smaller mass lies anterior and inferior to this, with peripheral calcification, measuring 5.2 x 3.5 cm. Masses are similar to the prior CT. Other: Small amount of ascites. Multiple small nodules along the greater omentum and mesentery consistent with metastatic deposits. Diffuse subcutaneous soft tissue edema. Musculoskeletal: No fracture or acute finding. No osteoblastic or osteolytic lesions.  CTA ABDOMEN WITH ILEAL FEMORAL RUNOFF VASCULAR Aorta: Normal in caliber.  Minor atherosclerosis.  No stenosis. Celiac: Included on the abdomen and pelvis CT.  Widely patent. SMA: Included on the abdomen and pelvis CT, widely patent. Renals: Included on the abdomen and pelvis CT. Right widely patent. Left with plaque at the origin, estimated 50% narrowing. IMA: Small but patent. RIGHT Lower Extremity Inflow: Common, internal and external iliac arteries are patent without evidence of aneurysm, dissection, vasculitis or significant stenosis. Outflow: Common, superficial and profunda femoral arteries and the popliteal artery are patent without evidence of aneurysm, dissection, vasculitis or significant stenosis. Runoff: Patent three vessel runoff to the ankle. LEFT Lower Extremity Inflow: Common, internal and external iliac arteries are patent without evidence of aneurysm, dissection, vasculitis or significant stenosis. Outflow: Common, superficial and profunda femoral arteries and the popliteal artery are patent without evidence of aneurysm, dissection, vasculitis or significant stenosis. Runoff: Patent three vessel runoff to the ankle. Veins: No obvious venous abnormality within the limitations of this arterial phase study. Abdomen and pelvic veins noted on the current CT are patent. Review of the MIP images confirms the above findings. IMPRESSION: CT  CHEST, ABDOMEN AND PELVIS 1. Lung findings with irregular interstitial thickening, there is with a reticulonodular appearance, as well as bronchiectasis and scarring, with an upper lobe predominance, may all be chronic. There may be a component of interstitial edema. 2. Small moderate right and small left pleural effusions. 3. Small pulmonary nodules, could reflect metastatic disease. 4. Liver diffusely enlarged with numerous metastatic lesions superimposed on extensive hepatic steatosis. Liver appearance has markedly progressed when compared to the CT from 09/13/2020. 5. Pelvic masses and peritoneal and omental nodules, as well as ascites, consistent with primary ovarian malignancy and peritoneal carcinomatosis. Ascites is new and mental disease has worsened compared to the prior CT. CTA ABDOMEN AND ILIOFEMORAL RUNOFF VASCULAR 1. No aortic dissection or aneurysm. 2. No significant stenosis of the aorta or its branch vessels. 3. Vessels patent throughout both lower extremities. No vascular findings to account for left lower leg pain and swelling. However, lower extremity veins are not well appreciated on the arterial phase exam. NON-VASCULAR 1. Subcutaneous soft tissue edema extending from the abdomen and pelvis through the lower extremities. 2. No soft tissue mass. No defined fluid collection to suggest an abscess. Electronically Signed   By: Lajean Manes M.D.   On: 09/15/2021 12:59   CT CHEST ABDOMEN PELVIS W CONTRAST  Result Date: 09/15/2021 CLINICAL DATA:  Left lower leg pain and swelling for 3 days. Low blood pressure. Metastatic carcinoma. EXAM: CT CHEST, ABDOMEN, AND PELVIS WITH CONTRAST CT ANGIOGRAPHY OF ABDOMINAL AORTA WITH ILIOFEMORAL RUNOFF TECHNIQUE: Multidetector CT imaging of the chest, abdomen and pelvis was performed following the standard protocol during bolus administration of intravenous contrast. Multidetector CT imaging of the abdomen, pelvis and lower extremities was performed using the  standard protocol during bolus administration of intravenous contrast. Multiplanar CT image reconstructions and MIPs were obtained to evaluate the vascular anatomy. RADIATION DOSE REDUCTION: This exam was performed according to the departmental dose-optimization program which includes automated exposure control, adjustment of the mA and/or kV according to patient size and/or use of iterative reconstruction technique. CONTRAST:  187m OMNIPAQUE IOHEXOL 350 MG/ML SOLN COMPARISON:  CT abdomen pelvis, 09/13/2020. FINDINGS: CT CHEST FINDINGS Cardiovascular: Heart normal in size. No pericardial effusion. Great vessels are normal in caliber. Mild aortic atherosclerosis. Mediastinum/Nodes: No neck base, mediastinal or hilar masses or enlarged lymph nodes. Several small calcified  mediastinal and hilar lymph nodes. Trachea and esophagus are unremarkable. Lungs/Pleura: Small to moderate right and small left pleural effusions. Mild bronchiectasis and irregular interstitial thickening, most evident in the upper lobes. Areas of peripheral interstitial thickening have a reticulonodular appearance. Small discrete nodules, left lower lobe, image 79, series 3, 4 mm, right lower lobe, image 67, series 3, 4 mm and right lower lobe, image 57, series 3, 3 mm. Minor dependent atelectasis in the posterior right lower lobe. No pneumothorax. Musculoskeletal: No fracture or acute finding. No osteoblastic or osteolytic lesions. CT ABDOMEN PELVIS FINDINGS Hepatobiliary: Numerous hyperattenuating liver masses in setting of hepatic steatosis and hepatomegaly. Findings have significantly progressed when compared to the exam from 09/13/2020. Small dependent gallstone. No acute cholecystitis. No bile duct dilation. Pancreas: No pancreatic mass or inflammation. Spleen: Normal in size without focal abnormality. Adrenals/Urinary Tract: No adrenal masses. Symmetric renal enhancement and excretion. 8 mm posterior lower pole left renal mass consistent  with a cyst. No other renal masses, no stones and no hydronephrosis. Normal ureters. Bladder minimally distended. There are densities consistent with calcifications along the margin of the posterior bladder wall. No bladder mass. Stomach/Bowel: Stomach unremarkable. Small bowel and colon are normal in caliber. No wall thickening. No convincing inflammation. Vascular/Lymphatic: Aortic atherosclerosis. No aneurysm. No enlarged lymph node. Reproductive: Heterogeneous left pelvic/adnexal masses, largest 6.7 x 4.9 cm transversely. A smaller mass lies anterior and inferior to this, with peripheral calcification, measuring 5.2 x 3.5 cm. Masses are similar to the prior CT. Other: Small amount of ascites. Multiple small nodules along the greater omentum and mesentery consistent with metastatic deposits. Diffuse subcutaneous soft tissue edema. Musculoskeletal: No fracture or acute finding. No osteoblastic or osteolytic lesions. CTA ABDOMEN WITH ILEAL FEMORAL RUNOFF VASCULAR Aorta: Normal in caliber.  Minor atherosclerosis.  No stenosis. Celiac: Included on the abdomen and pelvis CT.  Widely patent. SMA: Included on the abdomen and pelvis CT, widely patent. Renals: Included on the abdomen and pelvis CT. Right widely patent. Left with plaque at the origin, estimated 50% narrowing. IMA: Small but patent. RIGHT Lower Extremity Inflow: Common, internal and external iliac arteries are patent without evidence of aneurysm, dissection, vasculitis or significant stenosis. Outflow: Common, superficial and profunda femoral arteries and the popliteal artery are patent without evidence of aneurysm, dissection, vasculitis or significant stenosis. Runoff: Patent three vessel runoff to the ankle. LEFT Lower Extremity Inflow: Common, internal and external iliac arteries are patent without evidence of aneurysm, dissection, vasculitis or significant stenosis. Outflow: Common, superficial and profunda femoral arteries and the popliteal artery are  patent without evidence of aneurysm, dissection, vasculitis or significant stenosis. Runoff: Patent three vessel runoff to the ankle. Veins: No obvious venous abnormality within the limitations of this arterial phase study. Abdomen and pelvic veins noted on the current CT are patent. Review of the MIP images confirms the above findings. IMPRESSION: CT CHEST, ABDOMEN AND PELVIS 1. Lung findings with irregular interstitial thickening, there is with a reticulonodular appearance, as well as bronchiectasis and scarring, with an upper lobe predominance, may all be chronic. There may be a component of interstitial edema. 2. Small moderate right and small left pleural effusions. 3. Small pulmonary nodules, could reflect metastatic disease. 4. Liver diffusely enlarged with numerous metastatic lesions superimposed on extensive hepatic steatosis. Liver appearance has markedly progressed when compared to the CT from 09/13/2020. 5. Pelvic masses and peritoneal and omental nodules, as well as ascites, consistent with primary ovarian malignancy and peritoneal carcinomatosis. Ascites is new and mental  disease has worsened compared to the prior CT. CTA ABDOMEN AND ILIOFEMORAL RUNOFF VASCULAR 1. No aortic dissection or aneurysm. 2. No significant stenosis of the aorta or its branch vessels. 3. Vessels patent throughout both lower extremities. No vascular findings to account for left lower leg pain and swelling. However, lower extremity veins are not well appreciated on the arterial phase exam. NON-VASCULAR 1. Subcutaneous soft tissue edema extending from the abdomen and pelvis through the lower extremities. 2. No soft tissue mass. No defined fluid collection to suggest an abscess. Electronically Signed   By: Lajean Manes M.D.   On: 09/15/2021 12:59   DG Chest Portable 1 View  Result Date: 09/15/2021 CLINICAL DATA:  Hypotension.  History of sarcoidosis.  Ex-smoker. EXAM: PORTABLE CHEST 1 VIEW COMPARISON:  05/03/2016 FINDINGS:  Numerous leads and wires project over the chest. Midline trachea. Normal heart size. No pleural effusion or pneumothorax. Left-greater-than-right, upper lobe predominant marked interstitial thickening with peribronchovascular nodularity. Left upper lobe volume loss with hilar retraction. Relative sparing of the right lung base. Findings are significantly progressive compared to 2018. IMPRESSION: Upper lung predominant peribronchovascular interstitial thickening and nodularity. Most likely related to the clinical history of sarcoidosis. Superimposed infection, including atypical etiologies, cannot be excluded given interval change since 2018. Electronically Signed   By: Abigail Miyamoto M.D.   On: 09/15/2021 11:40   US Venous Img Lower Bilateral (DVT)  Result Date: 09/15/2021 CLINICAL DATA:  Bilateral lower extremity edema over the last 2 days. EXAM: Bilateral LOWER EXTREMITY VENOUS DOPPLER ULTRASOUND TECHNIQUE: Gray-scale sonography with compression, as well as color and duplex ultrasound, were performed to evaluate the deep venous system(s) from the level of the common femoral vein through the popliteal and proximal calf veins. COMPARISON:  None Available. FINDINGS: VENOUS Normal compressibility of the common femoral, superficial femoral, and popliteal veins, as well as the visualized calf veins. Visualized portions of profunda femoral vein and great saphenous vein unremarkable. No filling defects to suggest DVT on grayscale or color Doppler imaging. Doppler waveforms show normal direction of venous flow, normal respiratory plasticity and response to augmentation. Limited views of the contralateral common femoral vein are unremarkable. OTHER None. Limitations: Some limitation due to bandages, but all visualized portions appear negative. IMPRESSION: Negative examination. No evidence of lower extremity DVT on either side. Electronically Signed   By: Nelson Chimes M.D.   On: 09/15/2021 11:20    Scheduled Meds:   Chlorhexidine Gluconate Cloth  6 each Topical Q0600   docusate sodium  100 mg Oral BID   enoxaparin (LOVENOX) injection  30 mg Subcutaneous Q24H   feeding supplement  1 Container Oral TID BM   furosemide  40 mg Intravenous Once   mupirocin ointment   Nasal BID   Continuous Infusions:  albumin human 25 g (09/16/21 1131)   [START ON 09/17/2021] ceFEPime (MAXIPIME) IV     dextrose 5% lactated ringers 75 mL/hr at 09/16/21 1130   metronidazole Stopped (09/16/21 0650)   norepinephrine (LEVOPHED) Adult infusion 10 mcg/min (09/16/21 1142)   [START ON 09/17/2021] vancomycin       LOS: 1 day   Critical Care Procedure Note Authorized and Performed by: Murvin Natal MD  Total Critical Care time:  55 mins Due to a high probability of clinically significant, life threatening deterioration, the patient required my highest level of preparedness to intervene emergently and I personally spent this critical care time directly and personally managing the patient.  This critical care time included obtaining a history; examining  the patient, pulse oximetry; ordering and review of studies; arranging urgent treatment with development of a management plan; evaluation of patient's response of treatment; frequent reassessment; and discussions with other providers.  This critical care time was performed to assess and manage the high probability of imminent and life threatening deterioration that could result in multi-organ failure.  It was exclusive of separately billable procedures and treating other patients and teaching time.    Irwin Brakeman, MD How to contact the [facility] Attending or Consulting provider [place] or covering provider during after hours [place], for this patient?  Check the care team in [facility] and look for a) attending/consulting TRH provider listed and b) the [facility] team listed Log into [email or web address] and use Fernando Salinas's universal password to access. If you do not have the password, please contact the  hospital operator. Locate the [facility] provider you are looking for under [facility] and page to a number that you can be directly reached. If you still have difficulty reaching the provider, please page the [facility] (Director on Call) for the Hospitalists listed on amion for assistance.  09/16/2021, 1:35 PM

## 2021-09-16 NOTE — Progress Notes (Signed)
Date and time results received: 09/16/21 11:15AM (use smartphrase ".now" to insert current time)  Test: Critical Lac: Lactic Acid Critical Value: 4.7  Name of Provider Notified: Murvin Natal  Orders Received? Or Actions Taken?: MD Wynetta Emery notified via Amion.  Joan Flores, RN

## 2021-09-16 NOTE — Consult Note (Signed)
Consultation Note Date: 09/16/2021   Patient Name: Emma Russell  DOB: 12-Jan-1951  MRN: [id number]  Age / Sex: 71 y.o., female  PCP: Emma Spar, MD Referring Physician: Murlean Iba, MD  Reason for Consultation: Establishing goals of care  HPI/Patient Profile: 71 y.o. female  with past medical history of sarcoidosis, stage IV metastatic ovarian cancer diagnosed approximately 1 year ago, causing chronic lymphedema, seen by Dr. Denman Russell July 2022 noted to not be a surgical candidate for tumor debulking, COPD, GERD, anxiety, HTN/HLD admitted on 09/15/2021 with septic shock requiring pressure support, peritoneal carcinomatosis.   Clinical Assessment and Goals of Care: I have reviewed medical records including EPIC notes, labs and imaging, received report from RN, assessed the patient.  Emma Russell is lying quietly in bed.  She appears acutely/chronically ill and very frail.  She is alert and oriented x3, able to make her basic needs known.  Her daughter/healthcare surrogate, Emma Russell is present at bedside along with granddaughter Emma Russell.  As we are visiting her son-in-law Emma Russell arrives.  We meet to discuss diagnosis prognosis, GOC, EOL wishes, disposition and options.  I introduced Palliative Medicine as specialized medical care for people living with serious illness. It focuses on providing relief from the symptoms and stress of a serious illness. The goal is to improve quality of life for both the patient and the family.  Emma Russell is active with [facility] for outpatient palliative services.  We focused on their current illness.  We talk in detail about Emma Russell' acute and chronic health concerns including, but not limited to, her metastatic cancer, sepsis and the treatment plan, selected labs in particular albumin, administration of blood products, kidney function, fluid resuscitation and  antibiotics for sepsis.  We talk about lymphedema and treatment.  The natural disease trajectory and expectations at EOL were discussed.  Advanced directives, concepts specific to code status, artifical feeding and hydration, and rehospitalization were considered and discussed.  Emma Russell elects to "treat the treatable, but allowing natural passing".  Her healthcare agent/daughter can abide her wish.  Emma Russell is Jehovah's Witness.  Her daughter/healthcare agent, Emma Russell, is also Jehovah's Witness.  They would not accept whole blood, but are agreeable to receive albumin.  Hospice and Palliative Care services outpatient were explained and offered.  We talk about time for outcomes.  We talk about the benefits of at home "treat the treatable" hospice care in detail.  We also talk about the benefits of "let nature take its course" at residential hospice.  Discussed the importance of continued conversation with family and the medical providers regarding overall plan of care and treatment options, ensuring decisions are within the context of the patient's values and GOCs.  Questions and concerns were addressed.  The patient and family were encouraged to call with questions or concerns.  PMT will continue to support holistically.  Conference with attending, bedside nursing staff, transition of care team related to patient condition, needs, goals of care.   HCPOA NEXT OF KIN -Mrs.  Russell names her daughter, Emma Russell, as her healthcare surrogate.  She tells me that she also has 2 sons, but they understand that Emma Russell is a healthcare surrogate.    SUMMARY OF RECOMMENDATIONS   At this point continue to treat the treatable but no CPR or intubation Although Jehovah's Witness, will accept albumin. Time for outcomes   Code Status/Advance Care Planning: DNR -verified with patient and HCPOA  Symptom Management:  Per hospitalist, no additional needs at this time.  Palliative  Prophylaxis:  Frequent Pain Assessment, Oral Care, and Turn Reposition  Additional Recommendations (Limitations, Scope, Preferences): Treat the treatable but no CPR or intubation  Psycho-social/Spiritual:  Desire for further Chaplaincy support:yes Additional Recommendations: Caregiving  Support/Resources and Education on Hospice  Prognosis:  Unable to determine, based on outcomes.   Discharge Planning:  To be determined, based on outcomes.       Primary Diagnoses: Present on Admission:  Hypotension due to hypovolemia  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  Clavicle fracture -- Left   Failure to thrive in adult  COPD (chronic obstructive pulmonary disease) (HCC)  Prediabetes  HLD (hyperlipidemia)  Ovarian mass  Hypoglycemia  Russell (acute kidney injury) ([place])  Lactic acidosis  Leg pain  Hyperbilirubinemia  Leukocytosis  Peritoneal carcinomatosis  DNR (do not resuscitate)  Uncontrolled pain  Septic shock    I have reviewed the medical record, interviewed the patient and family, and examined the patient. The following aspects are pertinent.  Past Medical History:  Diagnosis Date   Russell (acute kidney injury) ([place]) 09/15/2021   Anxiety    Cataract    COPD (chronic obstructive pulmonary disease) (HCC)    GERD (gastroesophageal reflux disease)    Headache    Hyperlipidemia    Hypertension    Incontinence of urine    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 09/15/2021   Pre-diabetes    Sarcoidosis    Thyromegaly 07/19/2020   Social History   Socioeconomic History   Marital status: Widowed    Spouse name: Not on file   Number of children: 4   Years of education: 12   Highest education level: Not on file  Occupational History   Occupation: Freight forwarder    Comment: courtyard storage  Tobacco Use   Smoking status: Never    Passive exposure: Past (16years)   Smokeless tobacco: Never  Vaping Use   Vaping Use: Never used  Substance and Sexual Activity    Alcohol use: Not Currently    Comment: occ   Drug use: No   Sexual activity: Not Currently    Birth control/protection: Surgical    Comment: tubal  Other Topics Concern   Not on file  Social History Narrative   Lives at home with grandson Emma Russell has ADHD and anxiety issues   His father Dellis Filbert is addicted and comes and goes   Is a widow   Social Determinants of Radio broadcast assistant Strain: Low Risk  (11/28/2020)   Overall Financial Resource Strain (CARDIA)    Difficulty of Paying Living Expenses: Not hard at all  Food Insecurity: No Food Insecurity (11/28/2020)   Hunger Vital Sign    Worried About Running Out of Food in the Last Year: Never true    [place] in the Last Year: Never true  Transportation Needs: No Transportation Needs (11/28/2020)   PRAPARE - Hydrologist (Medical): No    Lack of Transportation (Non-Medical):  No  Physical Activity: Sufficiently Active (11/28/2020)   Exercise Vital Sign    Days of Exercise per Week: 4 days    Minutes of Exercise per Session: 50 min  Stress: No Stress Concern Present (11/28/2020)   [place]    Feeling of Stress : Not at all  Social Connections: Moderately Isolated (08/28/2020)   Social Connection and Isolation Panel [NHANES]    Frequency of Communication with Friends and Family: Twice a week    Frequency of Social Gatherings with Friends and Family: Three times a week    Attends Religious Services: More than 4 times per year    Active Member of Clubs or Organizations: No    Attends Archivist Meetings: Patient refused    Marital Status: Widowed   Family History  Problem Relation Age of Onset   COPD Mother    Diabetes Mother    Hepatitis C Mother    Alcohol abuse Father    Lung cancer Father    Cancer Maternal Aunt    Uterine cancer Maternal Grandmother    Drug abuse Son 51       overdose    Drug abuse Son    Hepatitis C Son    Breast cancer Neg Hx    Colon cancer Neg Hx    Pancreatic cancer Neg Hx    Prostate cancer Neg Hx    Ovarian cancer Neg Hx    Scheduled Meds:  Chlorhexidine Gluconate Cloth  6 each Topical Q0600   docusate sodium  100 mg Oral BID   enoxaparin (LOVENOX) injection  30 mg Subcutaneous Q24H   feeding supplement  1 Container Oral TID BM   furosemide  40 mg Intravenous Once   mupirocin ointment   Nasal BID   Continuous Infusions:  albumin human 25 g (09/16/21 1131)   [START ON 09/17/2021] ceFEPime (MAXIPIME) IV     dextrose 5% lactated ringers 75 mL/hr at 09/16/21 1130   metronidazole Stopped (09/16/21 0650)   norepinephrine (LEVOPHED) Adult infusion 10 mcg/min (09/16/21 1142)   [START ON 09/17/2021] vancomycin     PRN Meds:.albuterol, HYDROmorphone (DILAUDID) injection, ondansetron **OR** ondansetron (ZOFRAN) IV, oxyCODONE, traMADol Medications Prior to Admission:  Prior to Admission medications   Medication Sig Start Date End Date Taking? Authorizing Provider  Ascorbic Acid (VITAMIN C) 1000 MG tablet Take 2,000 mg by mouth daily.   Yes [provider]  B Complex Vitamins (VITAMIN B COMPLEX) TABS Take 1 tablet by mouth daily.   Yes [provider]  cholecalciferol (VITAMIN D) 25 MCG (1000 UNIT) tablet Take 1,000 Units by mouth daily.   Yes [provider]  Coenzyme Q10 (CO Q 10) 100 MG CAPS Take 1 capsule by mouth daily.   Yes [provider]  Cyanocobalamin (VITAMIN B 12 PO) Take 1 tablet by mouth daily.   Yes [provider]  HORSE CHESTNUT PO Take 1 tablet by mouth daily.   Yes [provider]  Magnesium Gluconate POWD Take 400 mg by mouth daily.   Yes [provider]  Milk Thistle 150 MG CAPS Take 1 capsule by mouth in the morning, at noon, and at bedtime.   Yes [provider]  Turmeric (QC TUMERIC COMPLEX PO) Take 1,350 mg by mouth daily as needed.   Yes [provider]  vitamin k 100 MCG tablet Take 100 mcg by mouth daily.   Yes [provider]  Acetylcysteine (NAC PO)  Take by mouth.    [provider]   Allergies  Allergen Reactions   Codeine Nausea And Vomiting   Review of Systems  Unable to perform ROS: Acuity of condition    Physical Exam Vitals and nursing note reviewed.  Constitutional:      General: She is not in acute distress.    Appearance: She is ill-appearing.  HENT:     Mouth/Throat:     Mouth: Mucous membranes are moist.  Cardiovascular:     Rate and Rhythm: Normal rate.  Pulmonary:     Effort: Pulmonary effort is normal. No respiratory distress.  Musculoskeletal:        General: Swelling present.  Skin:    General: Skin is warm and dry.  Neurological:     Mental Status: She is alert and oriented to person, place, and time.  Psychiatric:        Mood and Affect: Mood normal.        Behavior: Behavior normal.     Vital Signs: BP 112/67   Pulse 92   Temp (!) 97.2 F (36.2 C) (Oral)   Resp (!) 21   Ht '5\' 3"'$  (1.6 m)   Wt 68.8 kg   SpO2 99%   BMI 26.87 kg/m  Pain Scale: 0-10 POSS *See Group Information*: 1-Acceptable,Awake and alert Pain Score: 0-No pain   SpO2: SpO2: 99 % O2 Device:SpO2: 99 % O2 Flow Rate: .   IO: Intake/output summary:  Intake/Output Summary (Last 24 hours) at 09/16/2021 1320 Last data filed at 09/16/2021 1130 Gross per 24 hour  Intake 4509.29 ml  Output 200 ml  Net 4309.29 ml    LBM:   Baseline Weight: Weight: 63.5 kg Most recent weight: Weight: 68.8 kg     Palliative Assessment/Data:   Flowsheet Rows    Flowsheet Row Most Recent Value  Intake Tab   Referral Department Hospitalist  Unit at Time of Referral ICU  Palliative Care Primary Diagnosis Sepsis/Infectious Disease  Date Notified 09/16/21  Palliative Care Type New Palliative care  Reason for referral Clarify Goals of Care  Date of Admission 09/15/21  Date first seen by Palliative Care  09/16/21  # of days Palliative referral response time 0 Day(s)  # of days IP prior to Palliative referral 1  Clinical Assessment   Palliative Performance Scale Score 30%  Pain Max last 24 hours Not able to report  Pain Min Last 24 hours Not able to report  Dyspnea Max Last 24 Hours Not able to report  Dyspnea Min Last 24 hours Not able to report  Psychosocial & Spiritual Assessment   Palliative Care Outcomes        Time In: 0840 Time Out: 0955 Time Total: 75 minutes  Greater than 50%  of this time was spent counseling and coordinating care related to the above assessment and plan.  Signed by: Drue Novel, NP   Please contact Palliative Medicine Team phone at [phone] for questions and concerns.  For individual provider: See Shea Evans

## 2021-09-16 NOTE — Progress Notes (Signed)
Peripherally Inserted Central Catheter Placement  The IV Nurse has discussed with the patient and/or persons authorized to consent for the patient, the purpose of this procedure and the potential benefits and risks involved with this procedure.  The benefits include less needle sticks, lab draws from the catheter, and the patient may be discharged home with the catheter. Risks include, but not limited to, infection, bleeding, blood clot (thrombus formation), and puncture of an artery; nerve damage and irregular heartbeat and possibility to perform a PICC exchange if needed/ordered by physician.  Alternatives to this procedure were also discussed.  Bard Power PICC patient education guide, fact sheet on infection prevention and patient information card has been provided to patient /or left at bedside.    PICC Placement Documentation  PICC Triple Lumen 09/16/21 Right Brachial 37 cm 0 cm (Active)  Indication for Insertion or Continuance of Line Vasoactive infusions 09/16/21 1800  Exposed Catheter (cm) 0 cm 09/16/21 1800  Site Assessment Clean, Dry, Intact 09/16/21 1800  Lumen #1 Status Flushed;Saline locked;Blood return noted 09/16/21 1800  Lumen #2 Status Flushed;Saline locked;Blood return noted 09/16/21 1800  Lumen #3 Status Flushed;Saline locked;Blood return noted 09/16/21 1800  Dressing Type Transparent;Securing device 09/16/21 1800  Dressing Status Antimicrobial disc in place;Clean, Dry, Intact 09/16/21 1800  Safety Lock Not Applicable 02/22/23 4695  Line Adjustment (NICU/IV Team Only) No 09/16/21 1800  Dressing Intervention New dressing;Other (Comment) 09/16/21 1800  Dressing Change Due 09/23/21 09/16/21 1800    R Arm from below the PICC site down to forearm, swollen and weeping. Can't use LUA due to broken L clavicle, unable to move by patient.   Enos Fling 09/16/2021, 6:25 PM

## 2021-09-16 NOTE — TOC Initial Note (Signed)
Transition of Care [facility]) - Initial/Assessment Note    Patient Details  Name: Emma Russell MRN: [id number] Date of Birth: August 29, 1950  [facility]) CM/SW Contact:    Shade Flood, LCSW Phone Number: 09/16/2021, 2:53 PM  Clinical Narrative:                  Pt admitted from home. She has a high readmission risk score. Met with pt and her daughter at bedside to assess and review dc planning.  Pt lives in her own home. Her grandson resides with her. Pt does not use any DME. She has been independent in ADLs prior to admission. Pt reports she can get to appointments and obtain medications as needed. Pt is active with [facility] for outpatient palliative services.  TOC will follow and assist with any HH/DME or other dc needs if they arise.  Expected Discharge Plan: Home/Self Care Barriers to Discharge: Continued Medical Work up   Patient Goals and CMS Choice Patient states their goals for this hospitalization and ongoing recovery are:: go home      Expected Discharge Plan and Services Expected Discharge Plan: Home/Self Care In-house Referral: Clinical Social Work     Living arrangements for the past 2 months: Single Family Home                                      Prior Living Arrangements/Services Living arrangements for the past 2 months: Single Family Home Lives with:: Relatives Patient language and need for interpreter reviewed:: Yes Do you feel safe going back to the place where you live?: Yes      Need for Family Participation in Patient Care: No (Comment)     Criminal Activity/Legal Involvement Pertinent to Current Situation/Hospitalization: No - Comment as needed  Activities of Daily Living   ADL Screening (condition at time of admission) Patient's cognitive ability adequate to safely complete daily activities?: Yes Is the patient deaf or have difficulty hearing?: No Does the patient have difficulty seeing, even when wearing  glasses/contacts?: No Does the patient have difficulty concentrating, remembering, or making decisions?: No Patient able to express need for assistance with ADLs?: Yes Does the patient have difficulty dressing or bathing?: No Independently performs ADLs?: Yes (appropriate for developmental age) Weakness of Legs: Both Weakness of Arms/Hands: Both  Permission Sought/Granted                  Emotional Assessment Appearance:: Appears stated age Attitude/Demeanor/Rapport: Engaged Affect (typically observed): Pleasant Orientation: : Oriented to Self, Oriented to Place, Oriented to  Time, Oriented to Situation Alcohol / Substance Use: Not Applicable Psych Involvement: No (comment)  Admission diagnosis:  Anasarca [R60.1] Malignant neoplasm of ovary, unspecified laterality ([place]) [C56.9] Anemia, unspecified type [D64.9] Metastatic malignant neoplasm, unspecified site ([place]) [C79.9] Hypotension due to hypovolemia [I95.89, E86.1] Patient Active Problem List   Diagnosis Date Noted   Pressure injury of skin 09/16/2021   Hypoalbuminemia 09/16/2021   UTI (urinary tract infection) 09/16/2021   Anasarca    Anemia    Hypotension due to hypovolemia 09/15/2021   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 09/15/2021   Clavicle fracture -- Left  09/15/2021   Failure to thrive in adult 09/15/2021   Hypoglycemia 09/15/2021   AKI (acute kidney injury) ([place]) 09/15/2021   Lactic acidosis from advanced liver metastases and tumor burden 09/15/2021   Leg pain 09/15/2021   Hyperbilirubinemia  09/15/2021   Leukocytosis 09/15/2021   Peritoneal carcinomatosis 09/15/2021   DNR (do not resuscitate) 09/15/2021   Uncontrolled pain 09/15/2021   Septic shock  09/15/2021   Lymphedema 08/30/2021   Ovarian mass 11/25/2020   Refusal of blood transfusions as patient is Jehovah's Witness 11/25/2020   Thyromegaly 07/19/2020   Vitamin D deficiency 05/27/2016   Prediabetes 05/27/2016   HLD (hyperlipidemia)  05/27/2016   Immunization refused 05/27/2016   Sarcoidosis    Essential hypertension    COPD (chronic obstructive pulmonary disease) ([place])    PCP:  Lindell Spar, MD Pharmacy:   [facility] [place], [place] [facility] [place] [address] [place] [address] Phone: [phone] Fax: [phone]     Social Determinants of Health (SDOH) Interventions    Readmission Risk Interventions    09/16/2021    2:48 PM  Readmission Risk Prevention Plan  Transportation Screening Complete  HRI or [place] Complete  Social Work Consult for [place] Planning/Counseling Complete  Palliative Care Screening Complete  Medication Review Press photographer) Complete

## 2021-09-16 NOTE — Progress Notes (Signed)
Right femoral line removed by RN's at 5573 with no complications. Pressure applied to site and then pressure dressing placed. Shift to Shift Nurse report done at bedside at [place] and dressing checked with night nurse which dressing was clean, dry and intact at the time with no active bleeding noted. Patient had no complaints of pain or discomfort. PICC dressing also checked with night nurse and was clean, dry and intact.

## 2021-09-16 NOTE — Assessment & Plan Note (Signed)
--   continue IV antibiotics, follow culture

## 2021-09-17 ENCOUNTER — Ambulatory Visit: Payer: Medicare Other | Admitting: Orthopedic Surgery

## 2021-09-17 DIAGNOSIS — Z7189 Other specified counseling: Secondary | ICD-10-CM | POA: Diagnosis not present

## 2021-09-17 DIAGNOSIS — R6521 Severe sepsis with septic shock: Secondary | ICD-10-CM | POA: Diagnosis not present

## 2021-09-17 DIAGNOSIS — A419 Sepsis, unspecified organism: Secondary | ICD-10-CM | POA: Diagnosis not present

## 2021-09-17 DIAGNOSIS — Z515 Encounter for palliative care: Secondary | ICD-10-CM | POA: Diagnosis not present

## 2021-09-17 LAB — CBC WITH DIFFERENTIAL/PLATELET
Abs Immature Granulocytes: 0.05 10*3/uL (ref 0.00–0.07)
Basophils Absolute: 0 10*3/uL (ref 0.0–0.1)
Basophils Relative: 0 %
Eosinophils Absolute: 0 10*3/uL (ref 0.0–0.5)
Eosinophils Relative: 0 %
HCT: 23.9 % — ABNORMAL LOW (ref 36.0–46.0)
Hemoglobin: 8 g/dL — ABNORMAL LOW (ref 12.0–15.0)
Immature Granulocytes: 0 %
Lymphocytes Relative: 11 %
Lymphs Abs: 1.3 10*3/uL (ref 0.7–4.0)
MCH: 26.8 pg (ref 26.0–34.0)
MCHC: 33.5 g/dL (ref 30.0–36.0)
MCV: 79.9 fL — ABNORMAL LOW (ref 80.0–100.0)
Monocytes Absolute: 0.3 10*3/uL (ref 0.1–1.0)
Monocytes Relative: 2 %
Neutro Abs: 10.5 10*3/uL — ABNORMAL HIGH (ref 1.7–7.7)
Neutrophils Relative %: 87 %
Platelets: 124 10*3/uL — ABNORMAL LOW (ref 150–400)
RBC: 2.99 MIL/uL — ABNORMAL LOW (ref 3.87–5.11)
RDW: 17.3 % — ABNORMAL HIGH (ref 11.5–15.5)
WBC: 12.1 10*3/uL — ABNORMAL HIGH (ref 4.0–10.5)
nRBC: 0 % (ref 0.0–0.2)

## 2021-09-17 LAB — COMPREHENSIVE METABOLIC PANEL
ALT: 19 U/L (ref 0–44)
AST: 30 U/L (ref 15–41)
Albumin: 2.1 g/dL — ABNORMAL LOW (ref 3.5–5.0)
Alkaline Phosphatase: 78 U/L (ref 38–126)
BUN: 29 mg/dL — ABNORMAL HIGH (ref 8–23)
CO2: 14 mmol/L — ABNORMAL LOW (ref 22–32)
Calcium: 5.9 mg/dL — CL (ref 8.9–10.3)
Chloride: 121 mmol/L — ABNORMAL HIGH (ref 98–111)
Creatinine, Ser: 1.78 mg/dL — ABNORMAL HIGH (ref 0.44–1.00)
GFR, Estimated: 31 mL/min — ABNORMAL LOW (ref >60)
Glucose, Bld: 125 mg/dL — ABNORMAL HIGH (ref 70–99)
Potassium: 3 mmol/L — ABNORMAL LOW (ref 3.5–5.1)
Sodium: 133 mmol/L — ABNORMAL LOW (ref 135–145)
Total Bilirubin: 2.7 mg/dL — ABNORMAL HIGH (ref 0.3–1.2)
Total Protein: 3.9 g/dL — ABNORMAL LOW (ref 6.5–8.1)

## 2021-09-17 LAB — GLUCOSE, CAPILLARY
Glucose-Capillary: 100 mg/dL — ABNORMAL HIGH (ref 70–99)
Glucose-Capillary: 72 mg/dL (ref 70–99)
Glucose-Capillary: 77 mg/dL (ref 70–99)
Glucose-Capillary: 94 mg/dL (ref 70–99)

## 2021-09-17 LAB — PROCALCITONIN: Procalcitonin: 14.81 ng/mL

## 2021-09-17 LAB — MAGNESIUM: Magnesium: 1.3 mg/dL — ABNORMAL LOW (ref 1.7–2.4)

## 2021-09-17 MED ORDER — MAGNESIUM SULFATE 4 GM/100ML IV SOLN
4.0000 g | Freq: Once | INTRAVENOUS | Status: AC
  Administered 2021-09-17: 4 g via INTRAVENOUS
  Filled 2021-09-17: qty 100

## 2021-09-17 MED ORDER — VANCOMYCIN HCL 1250 MG/250ML IV SOLN
1250.0000 mg | INTRAVENOUS | Status: AC
  Administered 2021-09-17: 1250 mg via INTRAVENOUS
  Filled 2021-09-17: qty 250

## 2021-09-17 MED ORDER — MIRTAZAPINE 15 MG PO TABS
7.5000 mg | ORAL_TABLET | Freq: Every day | ORAL | Status: DC
Start: 2021-09-17 — End: 2021-09-19
  Filled 2021-09-17: qty 1

## 2021-09-17 MED ORDER — ALPRAZOLAM 0.5 MG PO TABS
0.5000 mg | ORAL_TABLET | Freq: Three times a day (TID) | ORAL | Status: DC | PRN
Start: 2021-09-17 — End: 2021-09-18
  Administered 2021-09-17: 0.5 mg via ORAL
  Filled 2021-09-17: qty 1

## 2021-09-17 MED ORDER — FUROSEMIDE 20 MG PO TABS
20.0000 mg | ORAL_TABLET | Freq: Two times a day (BID) | ORAL | Status: DC
Start: 2021-09-17 — End: 2021-09-19
  Administered 2021-09-17 – 2021-09-18 (×2): 20 mg via ORAL
  Filled 2021-09-17 (×2): qty 1

## 2021-09-17 MED ORDER — POTASSIUM CHLORIDE CRYS ER 20 MEQ PO TBCR
40.0000 meq | EXTENDED_RELEASE_TABLET | ORAL | Status: AC
  Administered 2021-09-17 (×2): 40 meq via ORAL
  Filled 2021-09-17 (×2): qty 2

## 2021-09-17 MED ORDER — CALCIUM GLUCONATE-NACL 1-0.675 GM/50ML-% IV SOLN
1.0000 g | Freq: Once | INTRAVENOUS | Status: AC
  Administered 2021-09-17: 1000 mg via INTRAVENOUS
  Filled 2021-09-17: qty 50

## 2021-09-17 NOTE — Progress Notes (Addendum)
Palliative: Emma Russell is resting quietly in bed.  She greets me, making and mostly keeping eye contact.  She appears acutely/chronically ill and very frail.  She is alert and oriented, able to make her needs known.  Her daughter/healthcare surrogate, Emma Russell, her granddaughter Emma Russell, son-in-law Emma Russell, and grandson Emma Russell are present.    Family meeting today with attending, Dr. Joesph Fillers, along with bedside nursing staff.  We talk in detail about Emma Russell's acute and chronic health concerns and the treatment plan including, but not limited to, IV fluids and fluid overload, kidney function and the use of Lasix, third spacing and albumin, lowering hemoglobin, lymphedema, cancer.  We talk about continuing care for her, what we can and cannot change.  We talk about her ability/desire to rehab.  We talk about her ability/desire to eat and drink.  We talk about hospice care whether at home "treat the treatable" hospice care versus residential hospice to "let nature take its course".  We talk about preferred place of death.  I shared that Emma Russell is facing some hard choices in the future.  The medical team encourages her and her family to talk about what she wants this time to look like and feel like.  Conference with attending, bedside nursing staff, transition of care team related to patient condition, needs, goals of care, disposition.    Plan: At this point continue to treat the treatable but no CPR or intubation.  Time for outcomes.  PMT to follow.  27 minutes Quinn Axe, NP Palliative medicine team Team phone [phone] Greater than 50% of this time was spent counseling and coordinating care related to the above assessment and plan./

## 2021-09-17 NOTE — TOC Progression Note (Signed)
Transition of Care [facility]) - Progression Note    Patient Details  Name: Emma Russell MRN: [id number] Date of Birth: Sep 03, 1950  Transition of Care [facility]) CM/SW Contact  Salome Arnt, [place] Phone Number: 09/17/2021, 2:48 PM  Clinical Narrative: Per MD, refer to residential hospice. LCSW contacted Roselee Nova with [facility] as pt is active with outpatient palliative. Fabio Pierce reports their residential hospice is 2 weeks or less. MD feels prognosis is 4-6 weeks. Referred to [facility]. Cassandra at [facility] reviewing.      Expected Discharge Plan: Home/Self Care Barriers to Discharge: Continued Medical Work up  Expected Discharge Plan and Services Expected Discharge Plan: Home/Self Care In-house Referral: Clinical Social Work     Living arrangements for the past 2 months: Single Family Home                                       Social Determinants of Health (SDOH) Interventions    Readmission Risk Interventions    09/16/2021    2:48 PM  Readmission Risk Prevention Plan  Transportation Screening Complete  HRI or [place] Complete  Social Work Consult for [place] Planning/Counseling Complete  Palliative Care Screening Complete  Medication Review Press photographer) Complete

## 2021-09-17 NOTE — Progress Notes (Signed)
Initial Nutrition Assessment  DOCUMENTATION CODES:   Non-severe (moderate) malnutrition in context of chronic illness  INTERVENTION:  Boost Breeze po TID, each supplement provides 250 kcal and 9 grams of protein   Regular diet- allow for comfort feeding   NUTRITION DIAGNOSIS:   Moderate Malnutrition related to chronic illness, catabolic illness as evidenced by mild fat depletion, moderate fat depletion, moderate muscle depletion.   GOAL:   (honor pt healthcare decisions and provide support)   MONITOR:  PO intake, Supplement acceptance, Labs, I & O's, Skin, Weight trends (honor pt healthcare decisions and provide support)  REASON FOR ASSESSMENT:   Consult Assessment of nutrition requirement/status  ASSESSMENT:  Patient is a 71 yo female with hx of ovarian cancer stage IV (metastatic), sarcoidosis, COPD, anemia, hypertension, chronic lymphedema. Presents with sepsis, lactic acid 4.7.  Palliative Care following-GOC discussions. Family meeting today.  Talked with nursing. Patient surrounded by family during RD visit. She has had a few bites of food at breakfast and lunch, ice cream and sips of Boost Breeze and is trying an [place] ice.   Weights-patient reports weight of 140 lb at last Dr visit and she was there due to excess fluid. Dry unknown. Weight gain since June 5 kg. Unable to complete full NFPE due to edema. Patient malnourished based on limited physical exam and diet hx.  Medications reviewed and include: colace BID IVF-D5 in Lactated ringers @ 75 ml/hr- stopped. 7/12 IV-Maxipime and Flagyl     Latest Ref Rng & Units 09/17/2021    4:03 AM 09/16/2021    3:41 AM 09/15/2021    6:52 PM  BMP  Glucose 70 - 99 mg/dL 125  133    BUN 8 - 23 mg/dL 29  32    Creatinine 0.44 - 1.00 mg/dL 1.78  1.76  1.64   Sodium 135 - 145 mmol/L 133  134    Potassium 3.5 - 5.1 mmol/L 3.0  4.1    Chloride 98 - 111 mmol/L 121  111    CO2 22 - 32 mmol/L 14  16    Calcium 8.9 - 10.3 mg/dL 5.9   6.8        NUTRITION - FOCUSED PHYSICAL EXAM:  Flowsheet Row Most Recent Value  Orbital Region Mild depletion  Upper Arm Region Moderate depletion  Thoracic and Lumbar Region Mild depletion  Buccal Region No depletion  Temple Region Moderate depletion  Clavicle Bone Region Moderate depletion  Clavicle and Acromion Bone Region Severe depletion  Scapular Bone Region Moderate depletion  Dorsal Hand Unable to assess  [edema]  Patellar Region Unable to assess  Anterior Thigh Region Unable to assess  Posterior Calf Region Unable to assess  Edema (RD Assessment) Moderate  [generalized edema upper and lower extremities]  Hair Reviewed  Eyes Reviewed  Mouth Reviewed  Skin Reviewed  Nails Reviewed       Diet Order:   Diet Order             Diet regular Room service appropriate? Yes; Fluid consistency: Thin  Diet effective now                   EDUCATION NEEDS:  Education needs have been addressed  Skin:  Skin Assessment: Skin Integrity Issues: Skin Integrity Issues:: Stage I Stage I: coccyx  Last BM:  unknown  Height:   Ht Readings from Last 1 Encounters:  09/15/21 '5\' 3"'$  (1.6 m)    Weight:   Wt Readings from Last 1 Encounters:  09/15/21 68.8 kg    Ideal Body Weight:   52 kg  BMI:  Body mass index is 26.87 kg/m.  Estimated Nutritional Needs:   Kcal:  1700-1800  Protein:  88-95 gr  Fluid:  1700 ml daily   Colman Cater MS,RD,CSG,LDN Office: [phone] Pager: [phone]

## 2021-09-17 NOTE — Progress Notes (Signed)
PROGRESS NOTE   Emma Russell  GUY:[id number] DOB: [birth year] DOA: 09/15/2021 PCP: Lindell Spar, MD   Chief Complaint  Patient presents with   Leg Pain   Level of care: ICU  Brief Admission History:  71 year old female with history of sarcoidosis, COPD, GERD, chronic lymphedema and diagnosed approximately 1 year ago with stage IV metastatic ovarian cancer.  She was seen by Dr. Denman George once in July 2022 and noted not to be a surgical candidate for tumor debulking.  Patient elected not to undergo systemic treatment at that time.  She was sent home with palliative care.  She has been having twice monthly telephone visits with palliative care.  She is also establish primary care with Dr. Posey Pronto in [place].  Unfortunately she has continued to lose weight over the last year and her appetite has continued to worsen.  She can barely take 1 bite without feeling full.  She only takes sips of liquids.  Unfortunately she had a fall at home and was seen in the emergency department on 09/12/2021 and found to have a left clavicle fracture.  She also injured her left leg.  She continues to have pain and swelling in the left leg.  She denies having abdominal pain.  She has not required pain medication at home over the past year as the ovarian cancer has progressed.  She presented to the emergency department with weakness dizziness that she has been having for the last several days.  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  Patient denies fever and chills.  She has had occasional cough and chest congestion.  She denies dysuria.  Patient was noted to be significantly hypotensive when she arrived in the emergency department with a BP of 66/46.  She was given fluid hydration in the IV however her blood pressures did not improve.  A central line was placed and she was started on pressor support.  Her lactic acid was markedly elevated at 4.6 and her WBC was elevated at 13.7.  Her creatinine  was 1.43 with a BUN of 30.  She was hypoglycemic with a blood sugar of 64.  Her bilirubin was elevated at 1.9.  Her hemoglobin was 10.6.  She was started on treatment for septic shock and admission was requested for further management.   Assessment and Plan: * Septic shock  --pt requiring pressor support at this time -Weaned off Levophed on 09/17/2021 -Continue to monitor blood pressure closely  Peritoneal carcinomatosis -- early satiety and nausea -- patient had elected over 1 year ago not to undergo systemic treatment and was not a candidate at that time for surgical debulking treatment -- continue palliative care   Failure to thrive in adult -- secondary to untreated stage IV metastatic ovarian carcinoma with carcinomatosis -- Remeron for appetite stimulation and sleep -- -Patient request I will stop IV fluids -She will try to take nutritional supplements to avoid hypoglycemia  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 -- patient met with Dr. Denman George once 1 year ago and elected not to have systemic treatment and was not a candidate for surgical debulking treatment and patient has had no treatment for this since that time and metastatic disease, bulky tumors and carcinomatosis has progressed over the last year  UTI (urinary tract infection) -Stop vancomycin continue cefepime  Hypoalbuminemia -- after meeting with palliative patient and daughter consented to IV albumin understanding that it is considered a blood product and I made sure that  they understood that and I gave them additional time to think about it and they are agreeable to the IV albumin -- IV albumin 25 g IV Q6h x 3 doses, IV lasix x 1 dose after first dose of IV albumin   Uncontrolled pain -- involving left leg after recent fall at home; patient afraid of pain management but agreeable to trying medications to alleviate symptoms   DNR (do not resuscitate) -- discussed with patient and daughter at bedside and after  discussion patient would like to treat acute illness as we are able but expressed desire for natural death, no resuscitation, no intubation, ok to continue pressors for now, can readdress later if not able to wean off pressors, continue DNR order in hospital  Leukocytosis -- secondary to sepsis, undetermined infection, suspect underlying pneumonia given severe dehydration  Hyperbilirubinemia -- extensive liver involvement of tumor   Lactic acidosis from advanced liver metastases and tumor burden -- severe and multifactorial given severe dehydration, septic shock, untreated cancer  -- we may not be able to normalize lactate levels in this scenario -- lactate not responding to fluid boluses, will stop checking. It is likely elevated as a result of the invasive cancer, more specifically the advanced liver metastases   AKI (acute kidney injury) ([place]) -- secondary to severe dehydration and likely bulky tumors causing localized compromise to kidney function  -- repeating BMP in AM   Hypoglycemia - hydrate with IV fluid with dextrose   Clavicle fracture -- Left  -- pain management as ordered -- she is wearing a sling for comfort and support   Hypotension due to hypovolemia -- aggressive IV fluid resuscitation per sepsis protocols  Lymphedema -- longstanding problem but worse in last weeks, supportive measures ordered, try TED hoses if tolerated, elevate legs, no evidence of blood clot from the CT vascular imaging -- venous doppler imaging to ruled out deep vein thrombosis -- IV albumin and lasix ordered   Refusal of blood transfusions as patient is Jehovah's Witness -- Pt will not accept blood products under any circumstances  Ovarian mass -- tumor size has progressed over the last year since she was diagnosed -- prognosis remains poor   HLD (hyperlipidemia) -- stable   Prediabetes -- stable, no interventions needed   COPD (chronic obstructive pulmonary disease) (HCC) --  stable, bronchodilators as needed   DVT prophylaxis: enoxaparin  Code Status: DNR  Family Communication: daughter at bedside  Disposition: Status is: Inpatient Remains inpatient appropriate because: intensity    Consultants:  Palliative care  Procedures:  Femoral line placed in ED 7/10  Antimicrobials:  Cefepime/vancomycin 7/10>>  -Stop vancomycin on 09/17/2021 Subjective:  -Patient's daughter Maudie Mercury and granddaughter Kayla at bedside -No fevers -Feels weak and tired Objective: Vitals:   09/17/21 1630 09/17/21 1634 09/17/21 1700 09/17/21 1730  BP: (!) 111/51  (!) 99/51 (!) 87/49  Pulse: 88  82 79  Resp: (!) '21  19 18  ' Temp:  97.6 F (36.4 C)    TempSrc:  Oral    SpO2: 96%  95% 96%  Weight:      Height:        Intake/Output Summary (Last 24 hours) at 09/17/2021 1930 Last data filed at 09/17/2021 1622 Gross per 24 hour  Intake 2562.86 ml  Output 1100 ml  Net 1462.86 ml   Filed Weights   09/15/21 0900 09/15/21 1721  Weight: 63.5 kg 68.8 kg   Examination:  General exam: Appears calm and comfortable  Respiratory system: Clear to  auscultation. Rare basilar crackles heard.  Respiratory effort normal. Cardiovascular system: normal S1 & S2 heard. No JVD, murmurs, rubs, gallops or clicks. No pedal edema. Gastrointestinal system: Abdomen with hard hepatomegaly, nontender, normal BS.  Central nervous system: Alert and oriented. No focal neurological deficits. Extremities: Symmetric 5 x 5 power. Skin: 2+ pitting edema BLEs.  No rashes, lesions or ulcers. Psychiatry: Judgement and insight appear normal. Mood & affect appropriate.   Data Reviewed: I have personally reviewed following labs and imaging studies  CBC: Recent Labs  Lab 09/15/21 0914 09/15/21 1852 09/16/21 0341 09/17/21 0625  WBC 13.7* 18.7* 18.3* 12.1*  NEUTROABS 11.7*  --  15.2* 10.5*  HGB 10.6* 10.8* 10.2* 8.0*  HCT 31.7* 32.7* 31.5* 23.9*  MCV 81.3 81.5 83.6 79.9*  PLT 320 311 269 124*    Basic  Metabolic Panel: Recent Labs  Lab 09/15/21 0914 09/15/21 1852 09/16/21 0341 09/17/21 0403  NA 137  --  134* 133*  K 3.5  --  4.1 3.0*  CL 112*  --  111 121*  CO2 18*  --  16* 14*  GLUCOSE 64*  --  133* 125*  BUN 30*  --  32* 29*  CREATININE 1.43* 1.64* 1.76* 1.78*  CALCIUM 7.6*  --  6.8* 5.9*  MG  --   --  1.7 1.3*    CBG: Recent Labs  Lab 09/16/21 1816 09/17/21 0038 09/17/21 0725 09/17/21 1216 09/17/21 1636  GLUCAP 109* 94 77 72 100*    Recent Results (from the past 240 hour(s))  Culture, blood (routine x 2)     Status: None (Preliminary result)   Collection Time: 09/15/21  9:50 AM   Specimen: BLOOD RIGHT FOREARM  Result Value Ref Range Status   Specimen Description BLOOD RIGHT FOREARM  Final   Special Requests   Final    BOTTLES DRAWN AEROBIC AND ANAEROBIC Blood Culture adequate volume   Culture   Final    NO GROWTH 2 DAYS Performed at [facility], [address] [place], [place] [address]    Report Status PENDING  Incomplete  Culture, blood (routine x 2)     Status: None (Preliminary result)   Collection Time: 09/15/21 10:39 AM   Specimen: BLOOD RIGHT FOREARM  Result Value Ref Range Status   Specimen Description BLOOD RIGHT FOREARM  Final   Special Requests   Final    BOTTLES DRAWN AEROBIC ONLY Blood Culture results may not be optimal due to an inadequate volume of blood received in culture bottles   Culture   Final    NO GROWTH 2 DAYS Performed at [facility], [address] [place], [place] [address]    Report Status PENDING  Incomplete  Urine Culture     Status: Abnormal (Preliminary result)   Collection Time: 09/15/21  3:14 PM   Specimen: Urine, Catheterized  Result Value Ref Range Status   Specimen Description   Final    URINE, CATHETERIZED Performed at [facility], [address], [place], [place] [address]    Special Requests   Final    NONE Performed at [facility], [address], [place], [place] [address]    Culture (A)  Final     >=100,000 COLONIES/mL CITROBACTER KOSERI CULTURE REINCUBATED FOR BETTER GROWTH Performed at [facility], [place] [address], [place], [place] [address]    Report Status PENDING  Incomplete   Organism ID, Bacteria CITROBACTER KOSERI (A)  Final      Susceptibility   Citrobacter  koseri - MIC*    CEFAZOLIN <=4 SENSITIVE Sensitive     CEFEPIME <=0.12 SENSITIVE Sensitive     CEFTRIAXONE <=0.25 SENSITIVE Sensitive     CIPROFLOXACIN <=0.25 SENSITIVE Sensitive     GENTAMICIN <=1 SENSITIVE Sensitive     IMIPENEM <=0.25 SENSITIVE Sensitive     NITROFURANTOIN 64 INTERMEDIATE Intermediate     TRIMETH/SULFA <=20 SENSITIVE Sensitive     PIP/TAZO <=4 SENSITIVE Sensitive     * >=100,000 COLONIES/mL CITROBACTER KOSERI  MRSA Next Gen by PCR, Nasal     Status: Abnormal   Collection Time: 09/15/21  5:00 PM   Specimen: Nasal Mucosa; Nasal Swab  Result Value Ref Range Status   MRSA by PCR Next Gen DETECTED (A) NOT DETECTED Final    Comment: RESULT CALLED TO, READ BACK BY AND VERIFIED WITHWilleen Niece RN [id number] [id number] K FORSYTH (NOTE) The GeneXpert MRSA Assay (FDA approved for NASAL specimens only), is one component of a comprehensive MRSA colonization surveillance program. It is not intended to diagnose MRSA infection nor to guide or monitor treatment for MRSA infections. Test performance is not FDA approved in patients less than 46 years old. Performed at [facility], [address] [place], [place] [address]      Radiology Studies: DG CHEST PORT 1 VIEW  Result Date: 09/16/2021 CLINICAL DATA:  Dyspnea. EXAM: PORTABLE CHEST 1 VIEW COMPARISON:  Radiograph earlier today CT yesterday. FINDINGS: Right upper extremity PICC tip at the atrial caval junction. Increasing bilateral pleural effusions and bibasilar volume loss/atelectasis. Similar heterogeneous opacities typical of pulmonary edema. Linear left upper lobe scarring. No pneumothorax. IMPRESSION: 1. Increasing bilateral pleural effusions and  bibasilar volume loss/atelectasis. 2. Similar heterogeneous opacities typical of pulmonary edema. 3. Right upper extremity PICC tip at the atrial caval junction. Electronically Signed   By: Keith Rake M.D.   On: 09/16/2021 23:53   DG CHEST PORT 1 VIEW  Result Date: 09/16/2021 CLINICAL DATA:  Sepsis EXAM: PORTABLE CHEST 1 VIEW COMPARISON:  Chest x-ray dated September 15, 2021 FINDINGS: The heart size and mediastinal contours are within normal limits. Unchanged mild bilateral heterogeneous opacities. Unchanged linear opacity of the left upper lung, likely due to scarring. The visualized skeletal structures are unremarkable. IMPRESSION: Unchanged mild bilateral heterogeneous opacities, likely due to pulmonary edema. Electronically Signed   By: Yetta Glassman M.D.   On: 09/16/2021 10:44   [place] EKG SITE RITE  Result Date: 09/16/2021 If Site Rite image not attached, placement could not be confirmed due to current cardiac rhythm.   Scheduled Meds:  Chlorhexidine Gluconate Cloth  6 each Topical Q0600   docusate sodium  100 mg Oral BID   enoxaparin (LOVENOX) injection  30 mg Subcutaneous Q24H   feeding supplement  1 Container Oral TID BM   furosemide  20 mg Oral BID   mirtazapine  7.5 mg Oral QHS   mupirocin ointment   Nasal BID   sodium chloride flush  10-40 mL Intracatheter Q12H   Continuous Infusions:  ceFEPime (MAXIPIME) IV 200 mL/hr at 09/17/21 1621   metronidazole 500 mg (09/17/21 1758)     LOS: 2 days    Roxan Hockey, MD How to contact the [facility] Attending or Consulting provider [place] or covering provider during after hours [place], for this patient?  Check the care team in [facility] and look for a) attending/consulting TRH provider listed and b) the [facility] team listed Log into [email or web address] and use [place] universal password to access. If you do  not have the password, please contact the hospital operator. Locate the [facility] provider you are looking for under [facility] and page to a  number that you can be directly reached. If you still have difficulty reaching the provider, please page the [facility] (Director on Call) for the Hospitalists listed on amion for assistance.  09/17/2021, 7:30 PM

## 2021-09-18 DIAGNOSIS — Z515 Encounter for palliative care: Secondary | ICD-10-CM | POA: Diagnosis not present

## 2021-09-18 DIAGNOSIS — Z7189 Other specified counseling: Secondary | ICD-10-CM | POA: Diagnosis not present

## 2021-09-18 DIAGNOSIS — A419 Sepsis, unspecified organism: Secondary | ICD-10-CM | POA: Diagnosis not present

## 2021-09-18 DIAGNOSIS — E440 Moderate protein-calorie malnutrition: Secondary | ICD-10-CM | POA: Insufficient documentation

## 2021-09-18 DIAGNOSIS — R6521 Severe sepsis with septic shock: Secondary | ICD-10-CM | POA: Diagnosis not present

## 2021-09-18 LAB — GLUCOSE, CAPILLARY
Glucose-Capillary: 54 mg/dL — ABNORMAL LOW (ref 70–99)
Glucose-Capillary: 69 mg/dL — ABNORMAL LOW (ref 70–99)
Glucose-Capillary: 68 mg/dL — ABNORMAL LOW (ref 70–99)
Glucose-Capillary: 77 mg/dL (ref 70–99)
Glucose-Capillary: 77 mg/dL (ref 70–99)

## 2021-09-18 LAB — RENAL FUNCTION PANEL
Albumin: 2.2 g/dL — ABNORMAL LOW (ref 3.5–5.0)
Anion gap: 7 (ref 5–15)
BUN: 35 mg/dL — ABNORMAL HIGH (ref 8–23)
CO2: 18 mmol/L — ABNORMAL LOW (ref 22–32)
Calcium: 7.4 mg/dL — ABNORMAL LOW (ref 8.9–10.3)
Chloride: 111 mmol/L (ref 98–111)
Creatinine, Ser: 1.67 mg/dL — ABNORMAL HIGH (ref 0.44–1.00)
GFR, Estimated: 33 mL/min — ABNORMAL LOW (ref >60)
Glucose, Bld: 63 mg/dL — ABNORMAL LOW (ref 70–99)
Phosphorus: 3.6 mg/dL (ref 2.5–4.6)
Potassium: 4.4 mmol/L (ref 3.5–5.1)
Sodium: 136 mmol/L (ref 135–145)

## 2021-09-18 LAB — MAGNESIUM: Magnesium: 2.5 mg/dL — ABNORMAL HIGH (ref 1.7–2.4)

## 2021-09-18 MED ORDER — ORAL CARE MOUTH RINSE: 15.0000 mL | OROMUCOSAL | Status: DC | PRN

## 2021-09-18 MED ORDER — MORPHINE SULFATE (CONCENTRATE) 10 MG/0.5ML PO SOLN: 2.6000 mg | ORAL | Status: DC | PRN

## 2021-09-18 MED ORDER — DEXTROSE 50 % IV SOLN
INTRAVENOUS | Status: AC
  Administered 2021-09-18: 25 mL via INTRAVENOUS
  Filled 2021-09-18: qty 50

## 2021-09-18 MED ORDER — ALPRAZOLAM 0.25 MG PO TABS
0.2500 mg | ORAL_TABLET | Freq: Three times a day (TID) | ORAL | Status: DC | PRN
Start: 2021-09-18 — End: 2021-09-19

## 2021-09-18 MED ORDER — DEXTROSE 50 % IV SOLN: 25.0000 mL | Freq: Once | INTRAVENOUS | Status: AC

## 2021-09-18 NOTE — TOC Transition Note (Signed)
Transition of Care [facility]) - CM/SW Discharge Note   Patient Details  Name: Emma Russell MRN: [id number] Date of Birth: [birth year]  [facility] CM/SW Contact:  Shade Flood, LCSW Phone Number: 09/18/2021, 1:20 PM   Clinical Narrative:     Pt is accepted for care at [facility] and can transfer today. Hospice to arrange transport. RN to call report.  There are no other TOC needs for dc.  Final next level of care: [place] Barriers to Discharge: Barriers Resolved   Patient Goals and CMS Choice Patient states their goals for this hospitalization and ongoing recovery are:: go home      Discharge Placement                       Discharge Plan and Services In-house Referral: Clinical Social Work                                   Social Determinants of Health (SDOH) Interventions     Readmission Risk Interventions    09/16/2021    2:48 PM  Readmission Risk Prevention Plan  Transportation Screening Complete  HRI or [place] Complete  Social Work Consult for [place] Planning/Counseling Complete  Palliative Care Screening Complete  Medication Review Press photographer) Complete

## 2021-09-18 NOTE — Discharge Summary (Signed)
Emma Russell, is a 71 y.o. female  DOB April 15, 1950  MRN [id number].  Admission date:  09/15/2021  Admitting Physician  Murlean Iba, MD  Discharge Date:  09/18/2021   Primary MD  Lindell Spar, MD  Recommendations for primary care physician for things to follow:   Transfer to [facility]) with full comfort care  Admission Diagnosis  Anasarca [R60.1] Malignant neoplasm of ovary, unspecified laterality ([place]) [C56.9] Anemia, unspecified type [D64.9] Metastatic malignant neoplasm, unspecified site [facility]) [C79.9] Hypotension due to hypovolemia [I95.89, E86.1]   Discharge Diagnosis  Anasarca [R60.1] Malignant neoplasm of ovary, unspecified laterality ([place]) [C56.9] Anemia, unspecified type [D64.9] Metastatic malignant neoplasm, unspecified site ([place]) [C79.9] Hypotension due to hypovolemia [I95.89, E86.1]    Principal Problem:   Septic shock  Active Problems:   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   Failure to thrive in adult   Peritoneal carcinomatosis   COPD (chronic obstructive pulmonary disease) (HCC)   Prediabetes   HLD (hyperlipidemia)   Ovarian mass   Refusal of blood transfusions as patient is Jehovah's Witness   Lymphedema   Hypotension due to hypovolemia   Clavicle fracture -- Left    Hypoglycemia   AKI (acute kidney injury) ([place])   Lactic acidosis from advanced liver metastases and tumor burden   Leg pain   Hyperbilirubinemia   Leukocytosis   DNR (do not resuscitate)   Uncontrolled pain   Pressure injury of skin   Hypoalbuminemia   Anasarca   Anemia   UTI (urinary tract infection)   Malnutrition of moderate degree      Past Medical History:  Diagnosis Date   AKI (acute kidney injury) ([place]) 09/15/2021   Anemia    Anxiety    Cataract    COPD (chronic obstructive pulmonary disease) (HCC)    GERD (gastroesophageal reflux disease)     Headache    Hyperlipidemia    Hypertension    Incontinence of urine    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 09/15/2021   Pre-diabetes    Sarcoidosis    Thyromegaly 07/19/2020    Past Surgical History:  Procedure Laterality Date   ORIF WRIST FRACTURE Left 06/30/2017   Procedure: OPEN REDUCTION INTERNAL FIXATION (ORIF) WRIST FRACTURE;  Surgeon: Hiram Gash, MD;  Location: [place];  Service: Orthopedics;  Laterality: Left;   TUBAL LIGATION         HPI  from the history and physical done on the day of admission:   Chief Complaint: left leg pain   HPI: Emma Russell is a 71 year old female with history of sarcoidosis, COPD, GERD, chronic lymphedema and diagnosed approximately 1 year ago with stage IV metastatic ovarian cancer.  She was seen by Dr. Denman George once in July 2022 and noted not to be a surgical candidate for tumor debulking.  Patient elected not to undergo systemic treatment at that time.  She was sent home with palliative care.  She has been having twice monthly telephone visits with palliative care.  She is also establish primary care with Dr. Posey Pronto in [place].  Unfortunately she has continued to lose weight over the last year and her appetite has continued to worsen.  She can barely take 1 bite without feeling full.  She only takes sips of liquids.  Unfortunately she had a fall at home and was seen in the emergency department on 09/12/2021 and found to have a left clavicle fracture.  She also injured her left leg.  She continues to have pain and swelling in the left leg.  She denies having abdominal pain.  She has not required pain medication at home over the past year as the ovarian cancer has progressed.  She presented to the emergency department with weakness dizziness that she has been having for the last several days.  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  Patient denies fever and chills.  She has had occasional cough and chest congestion.   She denies dysuria.  Patient was noted to be significantly hypotensive when she arrived in the emergency department with a BP of 66/46.  She was given fluid hydration in the IV however her blood pressures did not improve.  A central line was placed and she was started on pressor support.  Her lactic acid was markedly elevated at 4.6 and her WBC was elevated at 13.7.  Her creatinine was 1.43 with a BUN of 30.  She was hypoglycemic with a blood sugar of 64.  Her bilirubin was elevated at 1.9.  Her hemoglobin was 10.6.  She was started on treatment for septic shock and admission was requested for further management.     Hospital Course:     71 year old female with history of sarcoidosis, COPD, GERD, chronic lymphedema and diagnosed approximately 1 year ago with stage IV metastatic ovarian cancer.  She was seen by Dr. Denman George once in July 2022 and noted not to be a surgical candidate for tumor debulking.  Patient elected not to undergo systemic treatment at that time.  She was sent home with palliative care.  She has been having twice monthly telephone visits with palliative care.  She is also establish primary care with Dr. Posey Pronto in [place].  Unfortunately she has continued to lose weight over the last year and her appetite has continued to worsen.  She can barely take 1 bite without feeling full.  She only takes sips of liquids.  Unfortunately she had a fall at home and was seen in the emergency department on 09/12/2021 and found to have a left clavicle fracture.  She also injured her left leg.  She continues to have pain and swelling in the left leg.  She denies having abdominal pain.  She has not required pain medication at home over the past year as the ovarian cancer has progressed.  She presented to the emergency department with weakness dizziness that she has been having for the last several days.  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  Patient denies fever and chills.  She  has had occasional cough and chest congestion.  She denies dysuria.  Patient was noted to be significantly hypotensive when she arrived in the emergency department with a BP of 66/46.  She was given fluid hydration in the IV however her blood pressures did not improve.  A central line was placed and she was started on pressor support.  Her lactic acid was markedly elevated at 4.6 and her WBC was elevated  at 13.7.  Her creatinine was 1.43 with a BUN of 30.  She was hypoglycemic with a blood sugar of 64.  Her bilirubin was elevated at 1.9.  Her hemoglobin was 10.6.  She was started on treatment for septic shock and admission was requested for further management.  Assessment and Plan: 1)Septic Shock  -Treated with vancomycin and cefepime Sepsis pathophysiology resolved --Weaned off Levophed on 09/17/2021 -Patient and family request transition to full comfort care and hospice -Hospice consult appreciated, accepted to residential hospice house  2)Peritoneal Carcinomatosis -- Early satiety and nausea persisted -- patient had elected over 1 year ago not to undergo systemic treatment and was not a candidate at that time for surgical debulking treatment -Feed liberally as tolerated  3)Failure to thrive in adult -- secondary to untreated stage IV metastatic ovarian carcinoma with carcinomatosis -- prognosis is poor  -- appetite remains very poor,  --Patient and family request transition to full comfort care and hospice -Hospice consult appreciated, accepted to residential hospice house  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 -- patient met with Dr. Denman George once 1 year ago and elected not to have systemic treatment and was not a candidate for surgical debulking treatment and patient has had no treatment for this since that time and metastatic disease, bulky tumors and carcinomatosis has progressed over the last year --Patient and family request transition to full comfort care and hospice -Hospice  consult appreciated, accepted to residential hospice house  UTI (urinary tract infection) Treated with vancomycin and cefepime  Uncontrolled pain -- involving left leg after recent fall at home; patient afraid of pain management but agreeable to trying medications to alleviate symptoms   DNR (do not resuscitate)/Social/Ethics- -- --Patient and family request transition to full comfort care and hospice -Hospice consult appreciated, accepted to residential hospice house  Lactic acidosis from advanced liver metastases and tumor burden and sepsis -- severe and multifactorial given severe dehydration, septic shock, untreated cancer  -- we may not be able to normalize lactate levels in this scenario -- lactate not responding to fluid boluses---It is likely elevated as a result of the invasive cancer, more specifically the advanced liver metastases   AKI (acute kidney injury) ([place]) -- secondary to severe dehydration and likely bulky tumors causing localized compromise to kidney function  -Creatinine stable at 1.67  Clavicle fracture -- Left  -- pain management as ordered -- she is wearing a sling for comfort and support   Chronic Lymphedema -- longstanding problem but worse in last weeks, supportive measures ordered, try TED hoses if tolerated, elevate legs, no evidence of blood clot from the CT vascular imaging -- venous doppler imaging without deep vein thrombosis  Acute on chronic anemia--- suspect due to underlying malignancy compounded by hemodilution due to IV fluids for sepsis  Refusal of blood transfusions as patient is Jehovah's Witness -- Pt will not accept blood products under any circumstances  Ovarian mass -- tumor size has progressed over the last year since she was diagnosed -- prognosis remains poor   HLD (hyperlipidemia) -- stable   Prediabetes -- stable, no interventions needed   COPD (chronic obstructive pulmonary disease) (HCC) -- stable, bronchodilators as  needed   Discharge Condition: Grave Prognosis --Patient and family request transition to full comfort care and hospice -Hospice consult appreciated, accepted to residential hospice house  Diet and Activity recommendation:  As advised  Discharge Instructions    Discharge Instructions     Call MD for:  difficulty breathing, headache  or visual disturbances   Complete by: As directed    Call MD for:  persistant dizziness or light-headedness   Complete by: As directed    Call MD for:  persistant nausea and vomiting   Complete by: As directed    Call MD for:  severe uncontrolled pain   Complete by: As directed    Call MD for:  temperature >100.4   Complete by: As directed    Diet general   Complete by: As directed    Discharge instructions   Complete by: As directed    1)Transfer to [facility]) with full comfort care   Discharge wound care:   Complete by: As directed    1)Transfer to [facility]) with full comfort care   Increase activity slowly   Complete by: As directed          Discharge Medications     Allergies as of 09/18/2021       Reactions   Codeine Nausea And Vomiting        Medication List     STOP taking these medications    cholecalciferol 25 MCG (1000 UNIT) tablet Commonly known as: VITAMIN D   Co Q 10 100 MG Caps   HORSE CHESTNUT PO   Magnesium Gluconate Powd   Milk Thistle 150 MG Caps   NAC PO   QC TUMERIC COMPLEX PO   VITAMIN B 12 PO   Vitamin B Complex Tabs   vitamin C 1000 MG tablet   vitamin k 100 MCG tablet               Discharge Care Instructions  (From admission, onward)           Start     Ordered   09/18/21 0000  Discharge wound care:       Comments: 1)Transfer to [facility]) with full comfort care   09/18/21 1345            Major procedures and Radiology Reports - PLEASE review detailed and final reports for all details, in brief  -   DG CHEST PORT 1 VIEW  Result Date: 09/16/2021 CLINICAL DATA:  Dyspnea. EXAM: PORTABLE CHEST 1 VIEW COMPARISON:  Radiograph earlier today CT yesterday. FINDINGS: Right upper extremity PICC tip at the atrial caval junction. Increasing bilateral pleural effusions and bibasilar volume loss/atelectasis. Similar heterogeneous opacities typical of pulmonary edema. Linear left upper lobe scarring. No pneumothorax. IMPRESSION: 1. Increasing bilateral pleural effusions and bibasilar volume loss/atelectasis. 2. Similar heterogeneous opacities typical of pulmonary edema. 3. Right upper extremity PICC tip at the atrial caval junction. Electronically Signed   By: Keith Rake M.D.   On: 09/16/2021 23:53   DG CHEST PORT 1 VIEW  Result Date: 09/16/2021 CLINICAL DATA:  Sepsis EXAM: PORTABLE CHEST 1 VIEW COMPARISON:  Chest x-ray dated September 15, 2021 FINDINGS: The heart size and mediastinal contours are within normal limits. Unchanged mild bilateral heterogeneous opacities. Unchanged linear opacity of the left upper lung, likely due to scarring. The visualized skeletal structures are unremarkable. IMPRESSION: Unchanged mild bilateral heterogeneous opacities, likely due to pulmonary edema. Electronically Signed   By: Yetta Glassman M.D.   On: 09/16/2021 10:44   [place] EKG SITE RITE  Result Date: 09/16/2021 If Site Rite image not attached, placement could not be confirmed due to current cardiac rhythm.  CT Angio Aortobifemoral W and/or Wo Contrast  Result Date: 09/15/2021 CLINICAL DATA:  Left lower leg pain and  swelling for 3 days. Low blood pressure. Metastatic carcinoma. EXAM: CT CHEST, ABDOMEN, AND PELVIS WITH CONTRAST CT ANGIOGRAPHY OF ABDOMINAL AORTA WITH ILIOFEMORAL RUNOFF TECHNIQUE: Multidetector CT imaging of the chest, abdomen and pelvis was performed following the standard protocol during bolus administration of intravenous contrast. Multidetector CT imaging of the abdomen, pelvis and lower extremities was  performed using the standard protocol during bolus administration of intravenous contrast. Multiplanar CT image reconstructions and MIPs were obtained to evaluate the vascular anatomy. RADIATION DOSE REDUCTION: This exam was performed according to the departmental dose-optimization program which includes automated exposure control, adjustment of the mA and/or kV according to patient size and/or use of iterative reconstruction technique. CONTRAST:  130m OMNIPAQUE IOHEXOL 350 MG/ML SOLN COMPARISON:  CT abdomen pelvis, 09/13/2020. FINDINGS: CT CHEST FINDINGS Cardiovascular: Heart normal in size. No pericardial effusion. Great vessels are normal in caliber. Mild aortic atherosclerosis. Mediastinum/Nodes: No neck base, mediastinal or hilar masses or enlarged lymph nodes. Several small calcified mediastinal and hilar lymph nodes. Trachea and esophagus are unremarkable. Lungs/Pleura: Small to moderate right and small left pleural effusions. Mild bronchiectasis and irregular interstitial thickening, most evident in the upper lobes. Areas of peripheral interstitial thickening have a reticulonodular appearance. Small discrete nodules, left lower lobe, image 79, series 3, 4 mm, right lower lobe, image 67, series 3, 4 mm and right lower lobe, image 57, series 3, 3 mm. Minor dependent atelectasis in the posterior right lower lobe. No pneumothorax. Musculoskeletal: No fracture or acute finding. No osteoblastic or osteolytic lesions. CT ABDOMEN PELVIS FINDINGS Hepatobiliary: Numerous hyperattenuating liver masses in setting of hepatic steatosis and hepatomegaly. Findings have significantly progressed when compared to the exam from 09/13/2020. Small dependent gallstone. No acute cholecystitis. No bile duct dilation. Pancreas: No pancreatic mass or inflammation. Spleen: Normal in size without focal abnormality. Adrenals/Urinary Tract: No adrenal masses. Symmetric renal enhancement and excretion. 8 mm posterior lower pole left renal  mass consistent with a cyst. No other renal masses, no stones and no hydronephrosis. Normal ureters. Bladder minimally distended. There are densities consistent with calcifications along the margin of the posterior bladder wall. No bladder mass. Stomach/Bowel: Stomach unremarkable. Small bowel and colon are normal in caliber. No wall thickening. No convincing inflammation. Vascular/Lymphatic: Aortic atherosclerosis. No aneurysm. No enlarged lymph node. Reproductive: Heterogeneous left pelvic/adnexal masses, largest 6.7 x 4.9 cm transversely. A smaller mass lies anterior and inferior to this, with peripheral calcification, measuring 5.2 x 3.5 cm. Masses are similar to the prior CT. Other: Small amount of ascites. Multiple small nodules along the greater omentum and mesentery consistent with metastatic deposits. Diffuse subcutaneous soft tissue edema. Musculoskeletal: No fracture or acute finding. No osteoblastic or osteolytic lesions. CTA ABDOMEN WITH ILEAL FEMORAL RUNOFF VASCULAR Aorta: Normal in caliber.  Minor atherosclerosis.  No stenosis. Celiac: Included on the abdomen and pelvis CT.  Widely patent. SMA: Included on the abdomen and pelvis CT, widely patent. Renals: Included on the abdomen and pelvis CT. Right widely patent. Left with plaque at the origin, estimated 50% narrowing. IMA: Small but patent. RIGHT Lower Extremity Inflow: Common, internal and external iliac arteries are patent without evidence of aneurysm, dissection, vasculitis or significant stenosis. Outflow: Common, superficial and profunda femoral arteries and the popliteal artery are patent without evidence of aneurysm, dissection, vasculitis or significant stenosis. Runoff: Patent three vessel runoff to the ankle. LEFT Lower Extremity Inflow: Common, internal and external iliac arteries are patent without evidence of aneurysm, dissection, vasculitis or significant stenosis. Outflow: Common, superficial and profunda femoral  arteries and the  popliteal artery are patent without evidence of aneurysm, dissection, vasculitis or significant stenosis. Runoff: Patent three vessel runoff to the ankle. Veins: No obvious venous abnormality within the limitations of this arterial phase study. Abdomen and pelvic veins noted on the current CT are patent. Review of the MIP images confirms the above findings. IMPRESSION: CT CHEST, ABDOMEN AND PELVIS 1. Lung findings with irregular interstitial thickening, there is with a reticulonodular appearance, as well as bronchiectasis and scarring, with an upper lobe predominance, may all be chronic. There may be a component of interstitial edema. 2. Small moderate right and small left pleural effusions. 3. Small pulmonary nodules, could reflect metastatic disease. 4. Liver diffusely enlarged with numerous metastatic lesions superimposed on extensive hepatic steatosis. Liver appearance has markedly progressed when compared to the CT from 09/13/2020. 5. Pelvic masses and peritoneal and omental nodules, as well as ascites, consistent with primary ovarian malignancy and peritoneal carcinomatosis. Ascites is new and mental disease has worsened compared to the prior CT. CTA ABDOMEN AND ILIOFEMORAL RUNOFF VASCULAR 1. No aortic dissection or aneurysm. 2. No significant stenosis of the aorta or its branch vessels. 3. Vessels patent throughout both lower extremities. No vascular findings to account for left lower leg pain and swelling. However, lower extremity veins are not well appreciated on the arterial phase exam. NON-VASCULAR 1. Subcutaneous soft tissue edema extending from the abdomen and pelvis through the lower extremities. 2. No soft tissue mass. No defined fluid collection to suggest an abscess. Electronically Signed   By: Lajean Manes M.D.   On: 09/15/2021 12:59   CT CHEST ABDOMEN PELVIS W CONTRAST  Result Date: 09/15/2021 CLINICAL DATA:  Left lower leg pain and swelling for 3 days. Low blood pressure. Metastatic  carcinoma. EXAM: CT CHEST, ABDOMEN, AND PELVIS WITH CONTRAST CT ANGIOGRAPHY OF ABDOMINAL AORTA WITH ILIOFEMORAL RUNOFF TECHNIQUE: Multidetector CT imaging of the chest, abdomen and pelvis was performed following the standard protocol during bolus administration of intravenous contrast. Multidetector CT imaging of the abdomen, pelvis and lower extremities was performed using the standard protocol during bolus administration of intravenous contrast. Multiplanar CT image reconstructions and MIPs were obtained to evaluate the vascular anatomy. RADIATION DOSE REDUCTION: This exam was performed according to the departmental dose-optimization program which includes automated exposure control, adjustment of the mA and/or kV according to patient size and/or use of iterative reconstruction technique. CONTRAST:  198m OMNIPAQUE IOHEXOL 350 MG/ML SOLN COMPARISON:  CT abdomen pelvis, 09/13/2020. FINDINGS: CT CHEST FINDINGS Cardiovascular: Heart normal in size. No pericardial effusion. Great vessels are normal in caliber. Mild aortic atherosclerosis. Mediastinum/Nodes: No neck base, mediastinal or hilar masses or enlarged lymph nodes. Several small calcified mediastinal and hilar lymph nodes. Trachea and esophagus are unremarkable. Lungs/Pleura: Small to moderate right and small left pleural effusions. Mild bronchiectasis and irregular interstitial thickening, most evident in the upper lobes. Areas of peripheral interstitial thickening have a reticulonodular appearance. Small discrete nodules, left lower lobe, image 79, series 3, 4 mm, right lower lobe, image 67, series 3, 4 mm and right lower lobe, image 57, series 3, 3 mm. Minor dependent atelectasis in the posterior right lower lobe. No pneumothorax. Musculoskeletal: No fracture or acute finding. No osteoblastic or osteolytic lesions. CT ABDOMEN PELVIS FINDINGS Hepatobiliary: Numerous hyperattenuating liver masses in setting of hepatic steatosis and hepatomegaly. Findings have  significantly progressed when compared to the exam from 09/13/2020. Small dependent gallstone. No acute cholecystitis. No bile duct dilation. Pancreas: No pancreatic mass or inflammation. Spleen: Normal  in size without focal abnormality. Adrenals/Urinary Tract: No adrenal masses. Symmetric renal enhancement and excretion. 8 mm posterior lower pole left renal mass consistent with a cyst. No other renal masses, no stones and no hydronephrosis. Normal ureters. Bladder minimally distended. There are densities consistent with calcifications along the margin of the posterior bladder wall. No bladder mass. Stomach/Bowel: Stomach unremarkable. Small bowel and colon are normal in caliber. No wall thickening. No convincing inflammation. Vascular/Lymphatic: Aortic atherosclerosis. No aneurysm. No enlarged lymph node. Reproductive: Heterogeneous left pelvic/adnexal masses, largest 6.7 x 4.9 cm transversely. A smaller mass lies anterior and inferior to this, with peripheral calcification, measuring 5.2 x 3.5 cm. Masses are similar to the prior CT. Other: Small amount of ascites. Multiple small nodules along the greater omentum and mesentery consistent with metastatic deposits. Diffuse subcutaneous soft tissue edema. Musculoskeletal: No fracture or acute finding. No osteoblastic or osteolytic lesions. CTA ABDOMEN WITH ILEAL FEMORAL RUNOFF VASCULAR Aorta: Normal in caliber.  Minor atherosclerosis.  No stenosis. Celiac: Included on the abdomen and pelvis CT.  Widely patent. SMA: Included on the abdomen and pelvis CT, widely patent. Renals: Included on the abdomen and pelvis CT. Right widely patent. Left with plaque at the origin, estimated 50% narrowing. IMA: Small but patent. RIGHT Lower Extremity Inflow: Common, internal and external iliac arteries are patent without evidence of aneurysm, dissection, vasculitis or significant stenosis. Outflow: Common, superficial and profunda femoral arteries and the popliteal artery are patent  without evidence of aneurysm, dissection, vasculitis or significant stenosis. Runoff: Patent three vessel runoff to the ankle. LEFT Lower Extremity Inflow: Common, internal and external iliac arteries are patent without evidence of aneurysm, dissection, vasculitis or significant stenosis. Outflow: Common, superficial and profunda femoral arteries and the popliteal artery are patent without evidence of aneurysm, dissection, vasculitis or significant stenosis. Runoff: Patent three vessel runoff to the ankle. Veins: No obvious venous abnormality within the limitations of this arterial phase study. Abdomen and pelvic veins noted on the current CT are patent. Review of the MIP images confirms the above findings. IMPRESSION: CT CHEST, ABDOMEN AND PELVIS 1. Lung findings with irregular interstitial thickening, there is with a reticulonodular appearance, as well as bronchiectasis and scarring, with an upper lobe predominance, may all be chronic. There may be a component of interstitial edema. 2. Small moderate right and small left pleural effusions. 3. Small pulmonary nodules, could reflect metastatic disease. 4. Liver diffusely enlarged with numerous metastatic lesions superimposed on extensive hepatic steatosis. Liver appearance has markedly progressed when compared to the CT from 09/13/2020. 5. Pelvic masses and peritoneal and omental nodules, as well as ascites, consistent with primary ovarian malignancy and peritoneal carcinomatosis. Ascites is new and mental disease has worsened compared to the prior CT. CTA ABDOMEN AND ILIOFEMORAL RUNOFF VASCULAR 1. No aortic dissection or aneurysm. 2. No significant stenosis of the aorta or its branch vessels. 3. Vessels patent throughout both lower extremities. No vascular findings to account for left lower leg pain and swelling. However, lower extremity veins are not well appreciated on the arterial phase exam. NON-VASCULAR 1. Subcutaneous soft tissue edema extending from the  abdomen and pelvis through the lower extremities. 2. No soft tissue mass. No defined fluid collection to suggest an abscess. Electronically Signed   By: Lajean Manes M.D.   On: 09/15/2021 12:59   DG Chest Portable 1 View  Result Date: 09/15/2021 CLINICAL DATA:  Hypotension.  History of sarcoidosis.  Ex-smoker. EXAM: PORTABLE CHEST 1 VIEW COMPARISON:  05/03/2016 FINDINGS: Numerous leads and  wires project over the chest. Midline trachea. Normal heart size. No pleural effusion or pneumothorax. Left-greater-than-right, upper lobe predominant marked interstitial thickening with peribronchovascular nodularity. Left upper lobe volume loss with hilar retraction. Relative sparing of the right lung base. Findings are significantly progressive compared to 2018. IMPRESSION: Upper lung predominant peribronchovascular interstitial thickening and nodularity. Most likely related to the clinical history of sarcoidosis. Superimposed infection, including atypical etiologies, cannot be excluded given interval change since 2018. Electronically Signed   By: Abigail Miyamoto M.D.   On: 09/15/2021 11:40   US Venous Img Lower Bilateral (DVT)  Result Date: 09/15/2021 CLINICAL DATA:  Bilateral lower extremity edema over the last 2 days. EXAM: Bilateral LOWER EXTREMITY VENOUS DOPPLER ULTRASOUND TECHNIQUE: Gray-scale sonography with compression, as well as color and duplex ultrasound, were performed to evaluate the deep venous system(s) from the level of the common femoral vein through the popliteal and proximal calf veins. COMPARISON:  None Available. FINDINGS: VENOUS Normal compressibility of the common femoral, superficial femoral, and popliteal veins, as well as the visualized calf veins. Visualized portions of profunda femoral vein and great saphenous vein unremarkable. No filling defects to suggest DVT on grayscale or color Doppler imaging. Doppler waveforms show normal direction of venous flow, normal respiratory plasticity and  response to augmentation. Limited views of the contralateral common femoral vein are unremarkable. OTHER None. Limitations: Some limitation due to bandages, but all visualized portions appear negative. IMPRESSION: Negative examination. No evidence of lower extremity DVT on either side. Electronically Signed   By: Nelson Chimes M.D.   On: 09/15/2021 11:20   DG Shoulder Left  Result Date: 09/12/2021 CLINICAL DATA:  Fall.  Left shoulder pain. EXAM: LEFT SHOULDER - 2+ VIEW COMPARISON:  None Available. FINDINGS: No evidence for shoulder dislocation. No shoulder separation. There is a short oblique fracture of the distal clavicle near the acromioclavicular joint. Bones are diffusely demineralized. IMPRESSION: Distal left clavicle fracture. Electronically Signed   By: Misty Stanley M.D.   On: 09/12/2021 12:22    Micro Results  Recent Results (from the past 240 hour(s))  Culture, blood (routine x 2)     Status: None (Preliminary result)   Collection Time: 09/15/21  9:50 AM   Specimen: BLOOD RIGHT FOREARM  Result Value Ref Range Status   Specimen Description BLOOD RIGHT FOREARM  Final   Special Requests   Final    BOTTLES DRAWN AEROBIC AND ANAEROBIC Blood Culture adequate volume   Culture   Final    NO GROWTH 3 DAYS Performed at [facility], [address], [place], [place] [address]    Report Status PENDING  Incomplete  Culture, blood (routine x 2)     Status: None (Preliminary result)   Collection Time: 09/15/21 10:39 AM   Specimen: BLOOD RIGHT FOREARM  Result Value Ref Range Status   Specimen Description BLOOD RIGHT FOREARM  Final   Special Requests   Final    BOTTLES DRAWN AEROBIC ONLY Blood Culture results may not be optimal due to an inadequate volume of blood received in culture bottles   Culture   Final    NO GROWTH 3 DAYS Performed at [facility], [address] [place], [place] [address]    Report Status PENDING  Incomplete  Urine Culture     Status: Abnormal (Preliminary result)    Collection Time: 09/15/21  3:14 PM   Specimen: Urine, Catheterized  Result Value Ref Range Status   Specimen Description   Final    URINE, CATHETERIZED  Performed at [facility], [address], [place], [place] [address]    Special Requests   Final    NONE Performed at [facility], [address], [place], [place] [address]    Culture (A)  Final    >=100,000 COLONIES/mL CITROBACTER KOSERI CULTURE REINCUBATED FOR BETTER GROWTH Performed at [facility] [address], [place], [place] [address]    Report Status PENDING  Incomplete   Organism ID, Bacteria CITROBACTER KOSERI (A)  Final      Susceptibility   Citrobacter koseri - MIC*    CEFAZOLIN <=4 SENSITIVE Sensitive     CEFEPIME <=0.12 SENSITIVE Sensitive     CEFTRIAXONE <=0.25 SENSITIVE Sensitive     CIPROFLOXACIN <=0.25 SENSITIVE Sensitive     GENTAMICIN <=1 SENSITIVE Sensitive     IMIPENEM <=0.25 SENSITIVE Sensitive     NITROFURANTOIN 64 INTERMEDIATE Intermediate     TRIMETH/SULFA <=20 SENSITIVE Sensitive     PIP/TAZO <=4 SENSITIVE Sensitive     * >=100,000 COLONIES/mL CITROBACTER KOSERI  MRSA Next Gen by PCR, Nasal     Status: Abnormal   Collection Time: 09/15/21  5:00 PM   Specimen: Nasal Mucosa; Nasal Swab  Result Value Ref Range Status   MRSA by PCR Next Gen DETECTED (A) NOT DETECTED Final    Comment: RESULT CALLED TO, READ BACK BY AND VERIFIED WITHWilleen Niece RN [id number] K FORSYTH (NOTE) The GeneXpert MRSA Assay (FDA approved for NASAL specimens only), is one component of a comprehensive MRSA colonization surveillance program. It is not intended to diagnose MRSA infection nor to guide or monitor treatment for MRSA infections. Test performance is not FDA approved in patients less than 16 years old. Performed at [facility], [address] [place], [place] [address]     Today   Hassell today has no no new concerns  -Appetite remains poor -Patient and daughter Maudie Mercury and patient's  granddaughter [name] are at bedside          Patient has been seen and examined prior to discharge   Objective   Blood pressure (!) 117/55, pulse 97, temperature 97.6 F (36.4 C), temperature source Oral, resp. rate (!) 27, height '5\' 3"'  (1.6 m), weight 68.7 kg, SpO2 95 %.   Intake/Output Summary (Last 24 hours) at 09/18/2021 1347 Last data filed at 09/18/2021 1124 Gross per 24 hour  Intake 1622.8 ml  Output 350 ml  Net 1272.8 ml    Exam Gen:- Awake Alert, no acute distress  HEENT:- .AT, No sclera icterus Neck-Supple Neck,No JVD,.  Lungs-diminished breath sounds, no wheezing  CV- S1, S2 normal, regular Abd-  +ve B.Sounds, Abd Soft, No tenderness,    Extremity/Skin:-Chronic lymphedema,   good pulses Psych-affect is appropriate, oriented x3 Neuro-generalized weakness , no new focal deficits, no tremors    Data Review   CBC w Diff:  Lab Results  Component Value Date   WBC 14.5 (H) 09/18/2021   HGB 8.7 (L) 09/18/2021   HGB 14.3 09/19/2020   HCT 25.5 (L) 09/18/2021   PLT 95 (L) 09/18/2021   PLT 205 09/19/2020   LYMPHOPCT 9 09/18/2021   MONOPCT 2 09/18/2021   EOSPCT 0 09/18/2021   BASOPCT 1 09/18/2021    CMP:  Lab Results  Component Value Date   NA 136 09/18/2021   K 4.4 09/18/2021   CL 111 09/18/2021   CO2 18 (L) 09/18/2021   BUN 35 (H) 09/18/2021   CREATININE 1.67 (H) 09/18/2021  PROT 3.9 (L) 09/17/2021   ALBUMIN 2.2 (L) 09/18/2021   BILITOT 2.7 (H) 09/17/2021   ALKPHOS 78 09/17/2021   AST 30 09/17/2021   ALT 19 09/17/2021  .  Total Discharge time is about 33 minutes  Roxan Hockey M.D on 09/18/2021 at 1:47 PM  Go to [email or web address] -  for contact info  [facility] - Office  [phone]

## 2021-09-18 NOTE — Progress Notes (Signed)
Report called to [employer] [facility]. Still awaiting EMS transport.

## 2021-09-18 NOTE — Progress Notes (Signed)
Hypoglycemic Event 08:23  CBG: 69  Treatment: 8 oz juice/soda  Symptoms: None  Follow-up CBG: Time:10:06 CBG Result:68  Possible Reasons for Event: Inadequate meal intake    Hypoglycemic Event 10:06  CBG: 68  Treatment: D50 25 mL (12.5 gm)  Symptoms: None  Follow-up CBG: Time:11:35 CBG Result:77  Possible Reasons for Event: Inadequate meal intake

## 2021-09-18 NOTE — Progress Notes (Signed)
Palliative: Emma Russell is sitting up quietly in bed.  She greets me, making and mostly keeping eye contact.  She appears acutely/chronically ill and very frail.  She is alert and oriented, able to make her basic needs known.  Her daughter Emma Russell, granddaughter Emma Russell are present at bedside.  We talk about her acute and chronic health concerns.  We talked about symptom management.  They shared that [facility] will send an intake coordinator to evaluate her for residential hospice at [place].  Questions answered related to goals of care, hospice care.   Conference with attending, [facility], bedside nursing staff, transition of care team related to patient condition, needs, goals of care, disposition.  Plan:   Comfort and dignity at end-of-life, residential hospice at [place]. Prognosis: Anticipate 5 to 10 days or less, based on acuity of condition, albumin of 1.6, advancing metastatic cancer, unable to maintain adequate nutrition/hydration/blood sugar levels. DNR/goldenrod form completed and placed on chart.  45 minutes Quinn Axe, NP Palliative medicine team Team phone [phone] Greater than 50% of this time was spent counseling and coordinating care related to the above assessment and plan.

## 2021-09-19 LAB — CBC WITH DIFFERENTIAL/PLATELET
Abs Immature Granulocytes: 0.05 10*3/uL (ref 0.00–0.07)
Basophils Absolute: 0.1 10*3/uL (ref 0.0–0.1)
Basophils Relative: 1 %
Eosinophils Absolute: 0 10*3/uL (ref 0.0–0.5)
Eosinophils Relative: 0 %
HCT: 25.5 % — ABNORMAL LOW (ref 36.0–46.0)
Hemoglobin: 8.7 g/dL — ABNORMAL LOW (ref 12.0–15.0)
Immature Granulocytes: 0 %
Lymphocytes Relative: 9 %
Lymphs Abs: 1.4 10*3/uL (ref 0.7–4.0)
MCH: 27.1 pg (ref 26.0–34.0)
MCHC: 34.1 g/dL (ref 30.0–36.0)
MCV: 79.4 fL — ABNORMAL LOW (ref 80.0–100.0)
Monocytes Absolute: 0.3 10*3/uL (ref 0.1–1.0)
Monocytes Relative: 2 %
Neutro Abs: 12.7 10*3/uL — ABNORMAL HIGH (ref 1.7–7.7)
Neutrophils Relative %: 88 %
Platelets: 95 10*3/uL — ABNORMAL LOW (ref 150–400)
RBC: 3.21 MIL/uL — ABNORMAL LOW (ref 3.87–5.11)
RDW: 17.6 % — ABNORMAL HIGH (ref 11.5–15.5)
WBC: 14.5 10*3/uL — ABNORMAL HIGH (ref 4.0–10.5)
nRBC: 0 % (ref 0.0–0.2)

## 2021-09-19 LAB — URINE CULTURE: Culture: >=100000 — AB

## 2021-09-19 NOTE — Progress Notes (Signed)
EMS arrival to transport patient to [facility]. Packet with d/c given to EMS. Attempt made to notify [facility] patient on the way, no answer. Dilaudid 0.'75mg'$  given to patient for transfer comfort. Vital signs stable. Belongings given to family/daughter. Foley in place and emptied. PICC line removed with out complications.  Erling Conte, RN

## 2021-09-20 LAB — CULTURE, BLOOD (ROUTINE X 2)
Culture: NO GROWTH
Culture: NO GROWTH
Special Requests: ADEQUATE

## 2021-09-23 ENCOUNTER — Ambulatory Visit: Payer: Medicare Other | Admitting: Rehabilitation

## 2021-09-23 ENCOUNTER — Other Ambulatory Visit: Payer: Self-pay | Admitting: Internal Medicine

## 2021-09-23 DIAGNOSIS — I890 Lymphedema, not elsewhere classified: Secondary | ICD-10-CM

## 2021-10-23 DIAGNOSIS — R404 Transient alteration of awareness: Secondary | ICD-10-CM | POA: Diagnosis not present

## 2021-10-23 DIAGNOSIS — R0602 Shortness of breath: Secondary | ICD-10-CM | POA: Diagnosis not present

## 2021-10-23 DIAGNOSIS — R0902 Hypoxemia: Secondary | ICD-10-CM | POA: Diagnosis not present

## 2021-10-27 ENCOUNTER — Telehealth: Payer: Self-pay | Admitting: Internal Medicine

## 2021-10-27 NOTE — Telephone Encounter (Signed)
Berneta Sages called from [place] # [id number] / Date of Death 11/18/21 - Note given to Dr Posey Pronto to sign Death Cert on [place]

## 2021-11-03 ENCOUNTER — Ambulatory Visit (HOSPITAL_COMMUNITY): Payer: Medicare Other | Admitting: Physical Therapy

## 2021-11-05 ENCOUNTER — Encounter (HOSPITAL_COMMUNITY): Payer: Medicare Other | Admitting: Physical Therapy

## 2021-11-07 ENCOUNTER — Encounter (HOSPITAL_COMMUNITY): Payer: Medicare Other | Admitting: Physical Therapy

## 2021-11-07 DEATH — deceased

## 2021-11-11 ENCOUNTER — Encounter (HOSPITAL_COMMUNITY): Payer: Medicare Other | Admitting: Physical Therapy

## 2021-11-13 ENCOUNTER — Encounter (HOSPITAL_COMMUNITY): Payer: Medicare Other | Admitting: Physical Therapy

## 2021-11-17 ENCOUNTER — Encounter (HOSPITAL_COMMUNITY): Payer: Medicare Other | Admitting: Physical Therapy

## 2021-11-19 ENCOUNTER — Encounter (HOSPITAL_COMMUNITY): Payer: Medicare Other | Admitting: Physical Therapy

## 2021-11-21 ENCOUNTER — Encounter (HOSPITAL_COMMUNITY): Payer: Medicare Other | Admitting: Physical Therapy

## 2021-11-24 ENCOUNTER — Encounter (HOSPITAL_COMMUNITY): Payer: Medicare Other | Admitting: Physical Therapy

## 2021-11-26 ENCOUNTER — Encounter (HOSPITAL_COMMUNITY): Payer: Medicare Other | Admitting: Physical Therapy

## 2021-11-28 ENCOUNTER — Encounter (HOSPITAL_COMMUNITY): Payer: Medicare Other | Admitting: Physical Therapy

## 2021-11-28 ENCOUNTER — Encounter: Payer: Medicare Other | Admitting: Internal Medicine

## 2021-12-01 ENCOUNTER — Encounter (HOSPITAL_COMMUNITY): Payer: Medicare Other | Admitting: Physical Therapy

## 2021-12-02 ENCOUNTER — Ambulatory Visit: Payer: Medicare Other

## 2021-12-03 ENCOUNTER — Encounter (HOSPITAL_COMMUNITY): Payer: Medicare Other | Admitting: Physical Therapy

## 2021-12-05 ENCOUNTER — Encounter (HOSPITAL_COMMUNITY): Payer: Medicare Other | Admitting: Physical Therapy

## 2021-12-08 ENCOUNTER — Encounter (HOSPITAL_COMMUNITY): Payer: Medicare Other | Admitting: Physical Therapy

## 2021-12-10 ENCOUNTER — Encounter (HOSPITAL_COMMUNITY): Payer: Medicare Other | Admitting: Physical Therapy

## 2021-12-12 ENCOUNTER — Encounter (HOSPITAL_COMMUNITY): Payer: Medicare Other | Admitting: Physical Therapy

## 2021-12-15 ENCOUNTER — Ambulatory Visit: Payer: Medicare Other

## 2021-12-15 ENCOUNTER — Encounter (HOSPITAL_COMMUNITY): Payer: Medicare Other | Admitting: Physical Therapy

## 2021-12-17 ENCOUNTER — Encounter (HOSPITAL_COMMUNITY): Payer: Medicare Other | Admitting: Physical Therapy

## 2021-12-19 ENCOUNTER — Encounter (HOSPITAL_COMMUNITY): Payer: Medicare Other | Admitting: Physical Therapy

## 2022-08-28 IMAGING — CT CT ABD-PELV W/O CM
2 of 4 series · 15 of 46 positions shown, 17 images · non-contrast
Comparison: None.

CLINICAL DATA: Elevated testosterone

EXAM:
CT ABDOMEN AND PELVIS WITHOUT CONTRAST
TECHNIQUE: Multidetector CT imaging of the abdomen and pelvis was performed
following the standard protocol without IV contrast.

[Series 2: axial st · axial · 0.69mm/px · z∈[+780,+1200]mm · 12 of 96 slices shown, 14 images]
[im 6/96  soft-tissue]
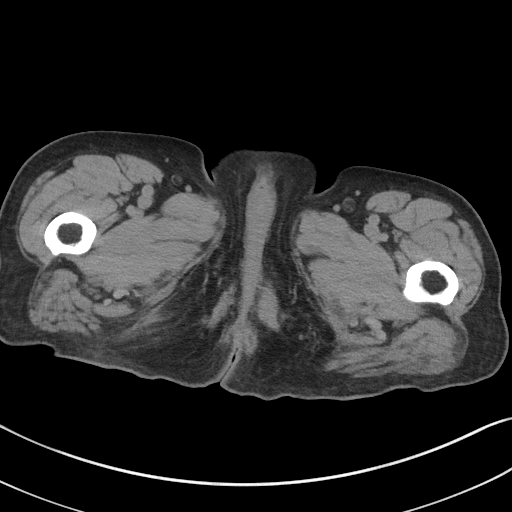
[im 6/96  bone]
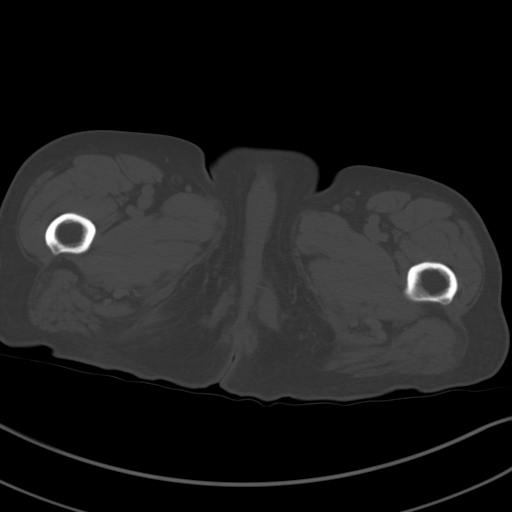
[im 16/96  soft-tissue]
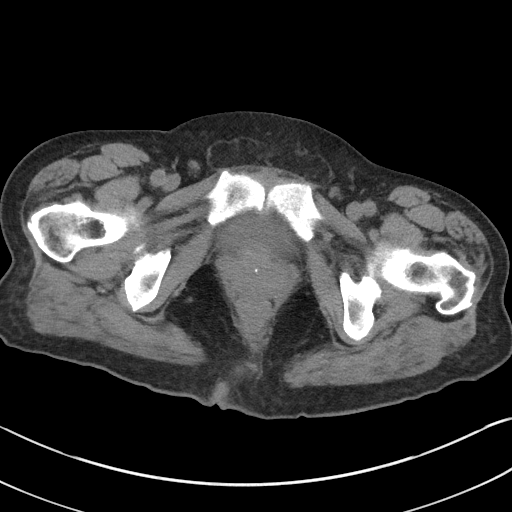
[im 22/96  soft-tissue]
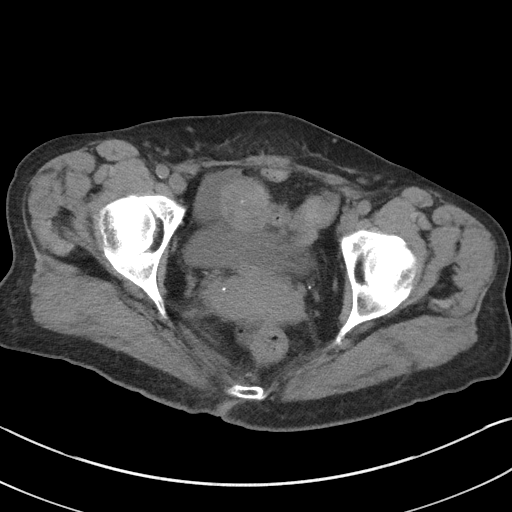
[im 27/96  soft-tissue]
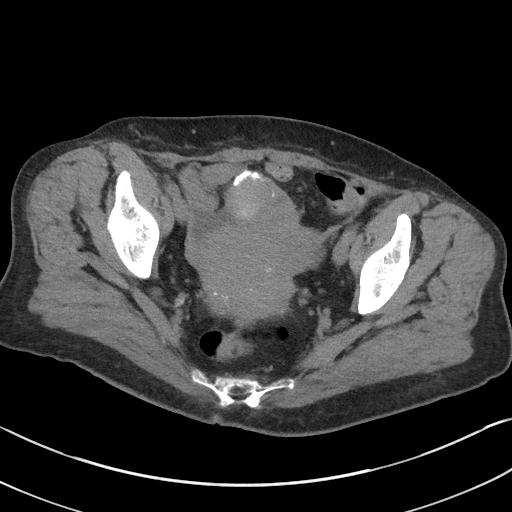
[im 37/96  soft-tissue]
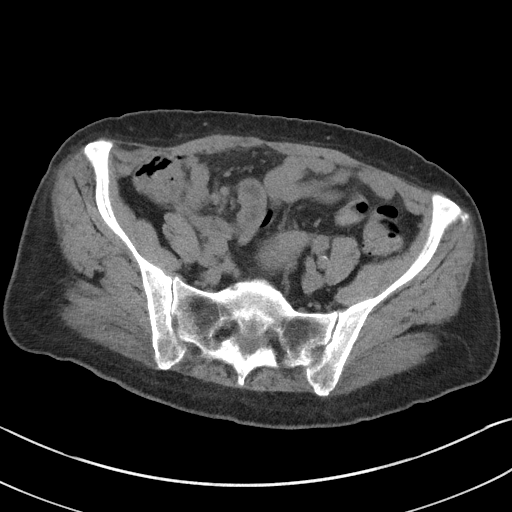
[im 43/96  soft-tissue]
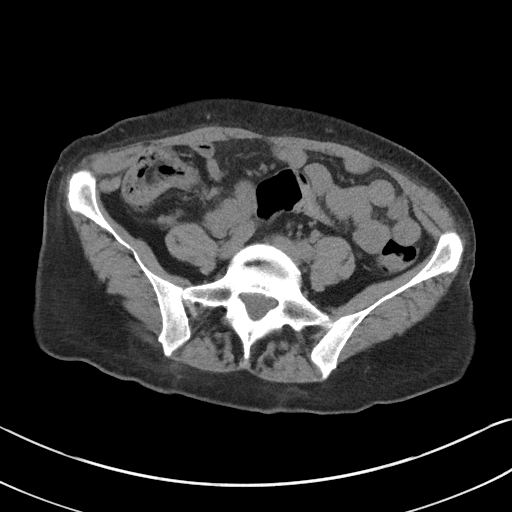
[im 53/96  soft-tissue]
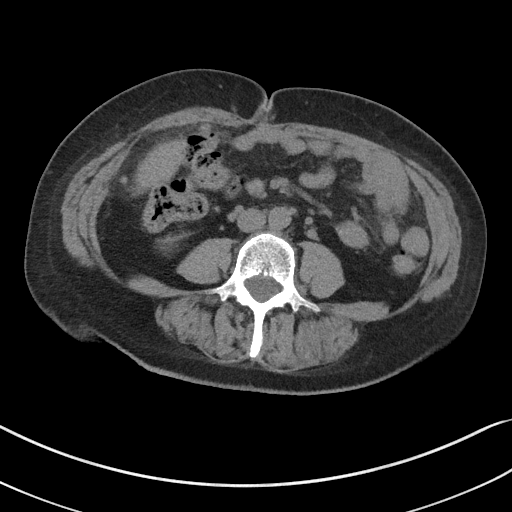
[im 59/96  soft-tissue]
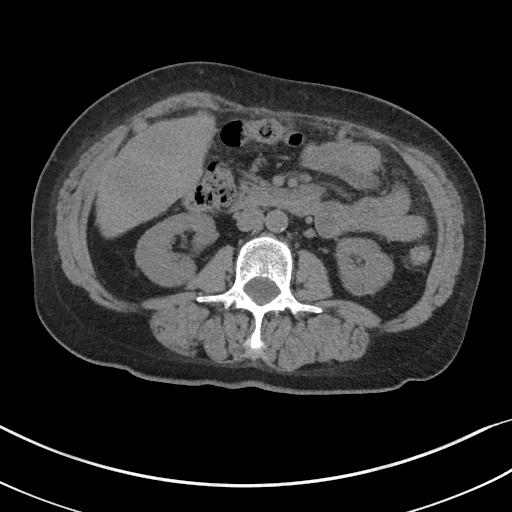
[im 69/96  soft-tissue]
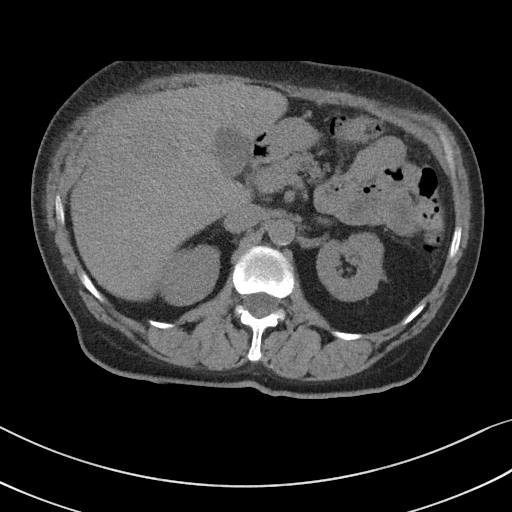
[im 69/96  bone]
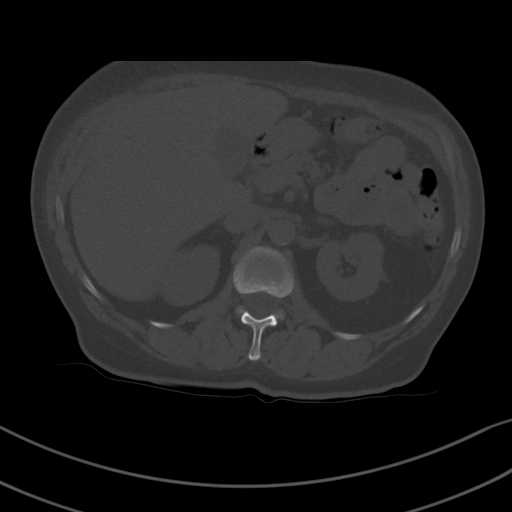
[im 74/96  soft-tissue]
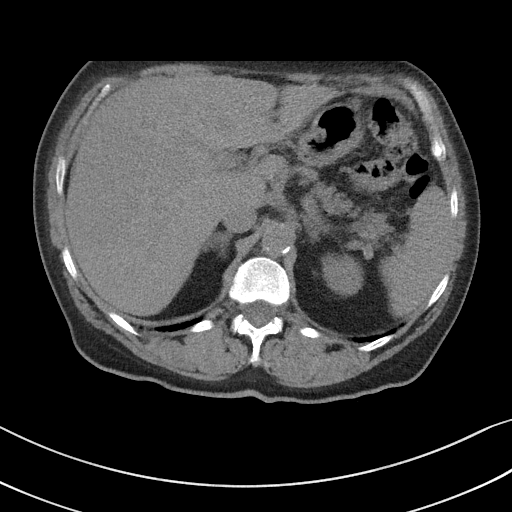
[im 80/96  soft-tissue]
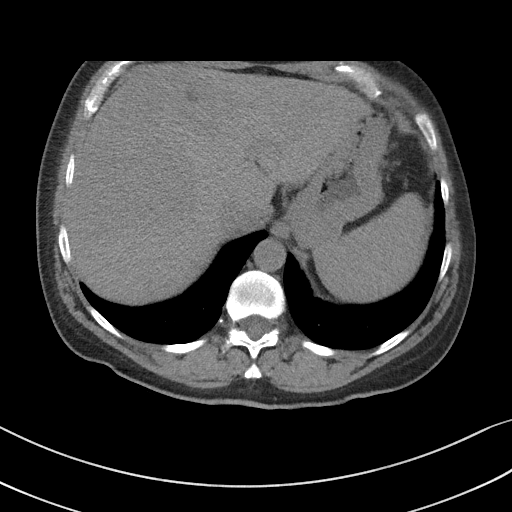
[im 90/96  soft-tissue]
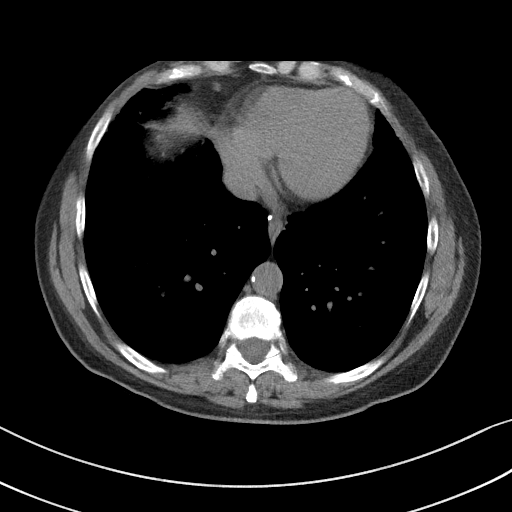

[Series 5: coronal st · coronal · 0.72mm/px · 3 of 90 slices shown]
[im 30/90  soft-tissue]
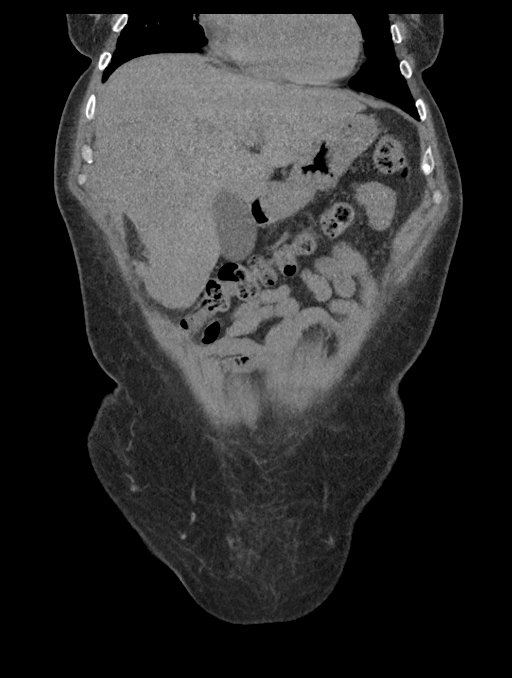
[im 40/90  soft-tissue]
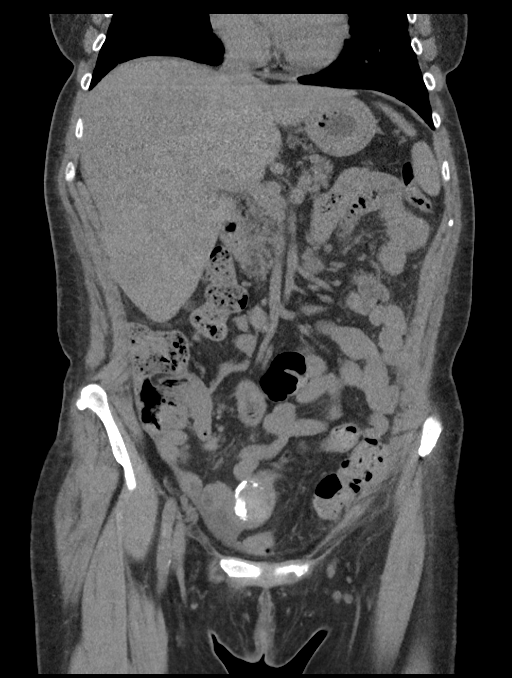
[im 50/90  soft-tissue]
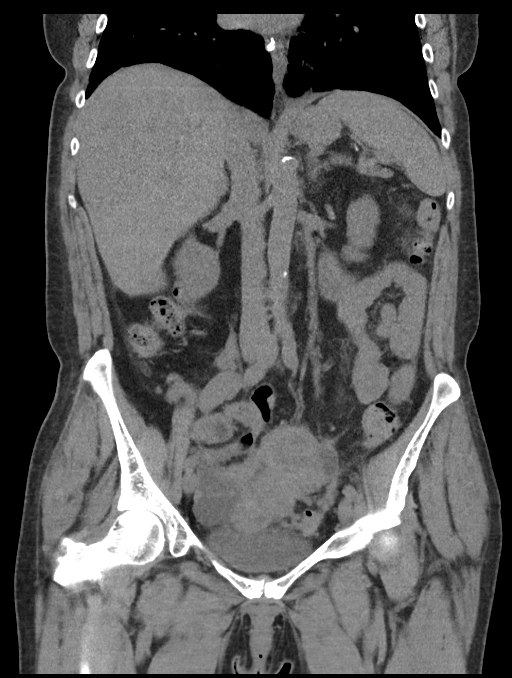

[15 of 46 positions shown; findings below may reference images not displayed]

FINDINGS: Lower chest: Innumerable small pulmonary nodules throughout the
included lung bases, largest in the left lower lobe measuring 6 mm
(series 4, image 4).

Hepatobiliary: Numerous heterogeneous, ill-defined hypodense masses
of the liver parenchyma. No gallstones, gallbladder wall thickening,
or biliary dilatation.

Pancreas: Unremarkable. No pancreatic ductal dilatation or
surrounding inflammatory changes.

Spleen: Normal in size without significant abnormality.

Adrenals/Urinary Tract: Fatty adenomatous thickening of the
bilateral adrenal glands. Kidneys are normal, without renal calculi,
solid lesion, or hydronephrosis. Bladder is unremarkable.

Stomach/Bowel: Stomach is within normal limits. Appendix appears
normal. There is a spiculated mass in the right lower quadrant small
bowel mesentery measuring 2.6 x 2.4 cm (series 2, image 57).

Vascular/Lymphatic: Aortic atherosclerosis. No enlarged abdominal or
pelvic lymph nodes.

Reproductive: Heterogeneous mixed solid and cystic mass of the left
ovary, measuring 6.7 x 5.8 cm (series 2, image 65). Uterine
fibroids.

Other: No abdominal wall hernia or abnormality. Small volume ascites
in the low pelvis. Multiple peritoneal and omental nodules
throughout the abdomen, for example anterior to the right lobe of
the liver measuring 1.0 cm (series 2, image 38) and in the left
upper quadrant measuring 0.9 cm (series 2, image 39).

Musculoskeletal: No acute or significant osseous findings.
IMPRESSION: 1. Heterogeneous mixed solid and cystic mass of the left ovary,
measuring 6.7 x 5.8 cm. Findings are concerning for primary ovarian
malignancy.
2. Numerous heterogeneous, ill-defined hypodense masses of the
liver.
3. There is a spiculated mass in the right lower quadrant small
bowel mesentery measuring 2.6 x 2.4 cm.
4. Multiple peritoneal and omental nodules throughout the abdomen.
5. Innumerable small pulmonary nodules throughout the included lung
bases, largest in the left lower lobe measuring 6 mm.
6. Findings are most consistent with primary ovarian malignancy and
widespread metastatic disease.
7. Uterine fibroids.
8. Small volume ascites in the low pelvis.

These results will be called to the ordering clinician or
representative by the Radiologist Assistant, and communication
documented in the PACS or [REDACTED].

Aortic Atherosclerosis ([id number]).
